# Patient Record
Sex: Male | Born: 1935 | Race: White | Hispanic: No | Smoking: Never smoker
Health system: Southern US, Community
[De-identification: ages and names within clinical notes are randomized; demographics above are authoritative.]

## PROBLEM LIST (undated history)

## (undated) DIAGNOSIS — I4891 Unspecified atrial fibrillation: Secondary | ICD-10-CM

## (undated) DIAGNOSIS — E7800 Pure hypercholesterolemia, unspecified: Secondary | ICD-10-CM

## (undated) DIAGNOSIS — J449 Chronic obstructive pulmonary disease, unspecified: Secondary | ICD-10-CM

## (undated) DIAGNOSIS — I10 Essential (primary) hypertension: Secondary | ICD-10-CM

## (undated) HISTORY — PX: REPLACEMENT TOTAL KNEE: SUR1224

## (undated) HISTORY — PX: CHOLECYSTECTOMY: SHX55

---

## 2003-12-15 ENCOUNTER — Other Ambulatory Visit: Payer: Self-pay

## 2004-10-04 ENCOUNTER — Ambulatory Visit: Payer: Self-pay | Admitting: Internal Medicine

## 2004-11-06 ENCOUNTER — Inpatient Hospital Stay: Payer: Self-pay | Admitting: Unknown Physician Specialty

## 2006-07-15 ENCOUNTER — Ambulatory Visit: Payer: Self-pay | Admitting: Unknown Physician Specialty

## 2006-07-24 ENCOUNTER — Ambulatory Visit: Payer: Self-pay | Admitting: Unknown Physician Specialty

## 2010-06-27 ENCOUNTER — Inpatient Hospital Stay: Payer: Self-pay | Admitting: General Surgery

## 2010-07-03 LAB — PATHOLOGY REPORT

## 2011-07-11 ENCOUNTER — Ambulatory Visit: Payer: Self-pay | Admitting: Unknown Physician Specialty

## 2011-09-04 ENCOUNTER — Ambulatory Visit: Payer: Self-pay | Admitting: Unknown Physician Specialty

## 2016-01-17 ENCOUNTER — Emergency Department: Payer: Medicare Other

## 2016-01-17 ENCOUNTER — Emergency Department
Admission: EM | Admit: 2016-01-17 | Discharge: 2016-01-18 | Disposition: A | Discharge status: Home / Self Care | Payer: Medicare Other | Attending: Student | Admitting: Student

## 2016-01-17 DIAGNOSIS — R4182 Altered mental status, unspecified: Secondary | ICD-10-CM | POA: Diagnosis present

## 2016-01-17 DIAGNOSIS — I4891 Unspecified atrial fibrillation: Secondary | ICD-10-CM | POA: Insufficient documentation

## 2016-01-17 DIAGNOSIS — I10 Essential (primary) hypertension: Secondary | ICD-10-CM

## 2016-01-17 DIAGNOSIS — J45909 Unspecified asthma, uncomplicated: Secondary | ICD-10-CM | POA: Insufficient documentation

## 2016-01-17 LAB — CBC
HEMATOCRIT: 46 % (ref 40.0–52.0)
HEMOGLOBIN: 15.5 g/dL (ref 13.0–18.0)
MCH: 30.5 pg (ref 26.0–34.0)
MCHC: 33.6 g/dL (ref 32.0–36.0)
MCV: 90.8 fL (ref 80.0–100.0)
Platelets: 203 10*3/uL (ref 150–440)
RBC: 5.07 MIL/uL (ref 4.40–5.90)
RDW: 13.5 % (ref 11.5–14.5)
WBC: 10 10*3/uL (ref 3.8–10.6)

## 2016-01-17 LAB — URINALYSIS COMPLETE WITH MICROSCOPIC (ARMC ONLY)
BILIRUBIN URINE: NEGATIVE
Bacteria, UA: NONE SEEN
GLUCOSE, UA: NEGATIVE mg/dL
Hgb urine dipstick: NEGATIVE
Ketones, ur: NEGATIVE mg/dL
Leukocytes, UA: NEGATIVE
NITRITE: NEGATIVE
PH: 6 (ref 5.0–8.0)
PROTEIN: NEGATIVE mg/dL
Specific Gravity, Urine: 1.009 (ref 1.005–1.030)
Squamous Epithelial / LPF: NONE SEEN
WBC, UA: NONE SEEN WBC/hpf (ref 0–5)

## 2016-01-17 LAB — BASIC METABOLIC PANEL
ANION GAP: 11 (ref 5–15)
BUN: 13 mg/dL (ref 6–20)
CALCIUM: 9.4 mg/dL (ref 8.9–10.3)
CHLORIDE: 99 mmol/L — AB (ref 101–111)
CO2: 27 mmol/L (ref 22–32)
CREATININE: 1.06 mg/dL (ref 0.61–1.24)
GFR calc non Af Amer: >60 mL/min (ref >60)
Glucose, Bld: 105 mg/dL — ABNORMAL HIGH (ref 65–99)
Potassium: 3.8 mmol/L (ref 3.5–5.1)
SODIUM: 137 mmol/L (ref 135–145)

## 2016-01-17 LAB — TROPONIN I

## 2016-01-17 NOTE — ED Notes (Signed)
Pt in with recent episodes of altered mental status, checked bp today and was elevated. Co generalized weakness no co pain.

## 2016-01-17 NOTE — ED Provider Notes (Signed)
[facility] Emergency Department Provider Note   ____________________________________________  Time seen: Approximately 8:22 PM  I have reviewed the triage vital signs and the nursing notes.   HISTORY  Chief Complaint Altered Mental Status    HPI Brian Lara is Lara 82 y.o. male with history of hypertension, history of atrial fibrillation, reactive airway disease, migraines who presents for evaluation of hypertension noted this evening, gradual onset, constant, no modifying factors, severe. Patient reports that he has felt "yucky" throughout the day, just did not feel like himself but is unable to further specify what he was feeling. He told his granddaughter and she checked his blood pressure which was really elevated with Lara systolic blood pressure in the 200s. She recommended he come to the emergency permit for evaluation. Currently he reports he feels fine. He has no chest pain, difficulty breathing, no headache, no vision changes, no numbness or weakness, no speech difficulty. He reports over the past several weeks he has had some times when he becomes confused but no confusion today. He reports 2 weeks ago, he "knew what I wanted to say but I couldn't get the words out". This lasted 5 minutes and resolved. He followed up with Dr. Lady Lara about this and is undergoing outpatient workup for possible TIAs. He has not experienced anything like that since.   No past medical history on file.  There are no active problems to display for this patient.   No past surgical history on file.  No current outpatient prescriptions on file.  Allergies Codeine and Morphine and related  No family history on file.  Social History Social History  Substance Use Topics  . Smoking status: Not on file  . Smokeless tobacco: Not on file  . Alcohol Use: Not on file    Review of Systems Constitutional: No fever/chills Eyes: No visual changes. ENT: No sore  throat. Cardiovascular: Denies chest pain. Respiratory: Denies shortness of breath. Gastrointestinal: No abdominal pain.  No nausea, no vomiting.  No diarrhea.  No constipation. Genitourinary: Negative for dysuria. Musculoskeletal: Negative for back pain. Skin: Negative for rash. Neurological: Negative for headaches, focal weakness or numbness.  10-point ROS otherwise negative.  ____________________________________________   PHYSICAL EXAM:  Filed Vitals:   01/17/16 2100 01/17/16 2130 01/17/16 2200 01/17/16 2303  BP: 178/78 179/79 171/83 150/80  Pulse: 54 57  60  Temp:      TempSrc:      Resp: 21  21 18   Height:      Weight:      SpO2: 95% 94%  94%    VITAL SIGNS: ED Triage Vitals  Enc Vitals Group     BP 01/17/16 1941 233/86 mmHg     Pulse Rate 01/17/16 1941 58     Resp 01/17/16 1941 18     Temp 01/17/16 1941 98.3 F (36.8 C)     Temp Source 01/17/16 1941 Oral     SpO2 01/17/16 1941 97 %     Weight 01/17/16 1941 198 lb (89.812 kg)     Height 01/17/16 1941 5\' 9"  (1.753 m)     Head Cir --      Peak Flow --      Pain Score --      Pain Loc --      Pain Edu? --      Excl. in GC? --     Constitutional: Alert and oriented x4. Well appearing and in no acute distress. Eyes: Conjunctivae are normal.  PERRL. EOMI. Head: Atraumatic. Nose: No congestion/rhinnorhea. Mouth/Throat: Mucous membranes are moist.  Oropharynx non-erythematous. Neck: No stridor. Supple without meningismus.  Cardiovascular: Normal rate, regular rhythm. Grossly normal heart sounds.  Good peripheral circulation. Respiratory: Normal respiratory effort.  No retractions. Lungs CTAB. Gastrointestinal: Soft and nontender. No distention. No CVA tenderness. Genitourinary: deferred Musculoskeletal: No lower extremity tenderness nor edema.  No joint effusions. Neurologic:  Normal speech and language. No gross focal neurologic deficits are appreciated. No gait instability. 5 out of 5 strength in bilateral  upper and lower extremities, sensation intact to light touch throughout, cranial nerves II through XII intact, normal finger-nose-finger. Skin:  Skin is warm, dry and intact. No rash noted. Psychiatric: Mood and affect are normal. Speech and behavior are normal.  ____________________________________________   LABS (all labs ordered are listed, but only abnormal results are displayed)  Labs Reviewed  BASIC METABOLIC PANEL - Abnormal; Notable for the following:    Chloride 99 (*)    Glucose, Bld 105 (*)    All other components within normal limits  URINALYSIS COMPLETEWITH MICROSCOPIC ([facility] ONLY) - Abnormal; Notable for the following:    Color, Urine YELLOW (*)    APPearance CLEAR (*)    All other components within normal limits  CBC  TROPONIN I   ____________________________________________  EKG  ED ECG REPORT I, Brian Lara, Brian Lara, the attending physician, personally viewed and interpreted this ECG.   Date: 01/17/2016  EKG Time: 19:48  Rate: 57  Rhythm: sinus bradycardia  Axis: left  Intervals:right bundle branch block  ST&T Change: No acute ST elevation or ST depression.  ____________________________________________  RADIOLOGY  CT head IMPRESSION: No acute intracranial abnormality. Moderate cerebral atrophy. Moderate periventricular and patchy subcortical white matter decreased attenuation probable due to chronic small vessel ischemic changes. No definite acute cortical infarction.   CXR IMPRESSION: Emphysematous changes and chronic bronchitic changes in the lungs. No evidence of active pulmonary disease. ____________________________________________   PROCEDURES  Procedure(s) performed: None  Critical Care performed: No  ____________________________________________   INITIAL IMPRESSION / ASSESSMENT AND PLAN / ED COURSE  Pertinent labs & imaging results that were available during my care of the patient were reviewed by me and considered in my medical  decision making (see chart for details).  Brian Lara is Lara 82 y.o. male with history of hypertension, history of atrial fibrillation, reactive airway disease, migraines who presents for evaluation of hypertension noted this evening. On exam, he is very well-appearing and in no acute distress. He has an intact neurological examination. His blood pressure was 233/86 howover this has improved to 171/84 without any intervention, we'll continue to monitor. The remainder of his vital signs are stable and he is afebrile. We'll obtain screening labs, CT head, chest x-ray and reassess for disposition. No clinical evidence currently to suggest end organ dysfunction as Lara result of his hypertension.  ----------------------------------------- 11:04 PM on 01/17/2016 ----------------------------------------- The patient's vital signs remainsstable, his blood pressure remains approximately 171/84, he has not required any antihypertensive medication since his arrival. EKG not consistent with ischemia, troponin negative, chest x-ray shows just evidence of reactive airway disease/bronchitic changes. CBC BMP unremarkable, negative troponin. Urinalysis is not consistent with infection, few RBCs noted. CT head shows no acute intracranial process. Return precautions, need for close PCP follow-up for titration of his antihypertensive medications, he is comfortable with the discharge plan. DC home.  ____________________________________________   FINAL CLINICAL IMPRESSION(S) / ED DIAGNOSES  Final diagnoses:  Essential hypertension  NEW MEDICATIONS STARTED DURING THIS VISIT:  New Prescriptions   No medications on file     Note:  This document was prepared using Dragon voice recognition software and may include unintentional dictation errors.    Brian Doss, MD 01/17/16 [id number]

## 2016-01-17 NOTE — ED Notes (Signed)
Pt to ED today for high blood pressure.  He reports he recently had an episode of expressive aphasia that resolved itself and had been following up with cardiologist, Dr. Lady GaryFath.  Pt denies confusion or aphasia today, just states he feels "yucky" with generalized weakness.

## 2016-01-17 NOTE — Discharge Instructions (Signed)
You were seen in the emergency department for high blood pressure which improved on its own. We found very a few red blood cells in your urine, this may resolve on its own but you need to follow up with your primary care doctor as soon as possible so that they can recheck your blood pressure and recheck your urine. Return immediately to the emergency department if you develop any headache, chest pain, difficulty breathing, speech difficulty, numbness, weakness, confusion, fevers, vomiting, diarrhea, or for any other concerns.

## 2018-04-30 ENCOUNTER — Emergency Department
Admission: EM | Admit: 2018-04-30 | Discharge: 2018-04-30 | Disposition: A | Discharge status: Home / Self Care | Payer: Medicare Other | Attending: Emergency Medicine | Admitting: Emergency Medicine

## 2018-04-30 ENCOUNTER — Other Ambulatory Visit: Payer: Self-pay

## 2018-04-30 ENCOUNTER — Emergency Department: Payer: Medicare Other

## 2018-04-30 ENCOUNTER — Encounter: Payer: Self-pay | Admitting: Emergency Medicine

## 2018-04-30 DIAGNOSIS — Y9389 Activity, other specified: Secondary | ICD-10-CM | POA: Diagnosis not present

## 2018-04-30 DIAGNOSIS — Y92003 Bedroom of unspecified non-institutional (private) residence as the place of occurrence of the external cause: Secondary | ICD-10-CM | POA: Diagnosis not present

## 2018-04-30 DIAGNOSIS — Z23 Encounter for immunization: Secondary | ICD-10-CM | POA: Diagnosis not present

## 2018-04-30 DIAGNOSIS — Z7982 Long term (current) use of aspirin: Secondary | ICD-10-CM | POA: Insufficient documentation

## 2018-04-30 DIAGNOSIS — S0990XA Unspecified injury of head, initial encounter: Secondary | ICD-10-CM | POA: Diagnosis present

## 2018-04-30 DIAGNOSIS — Z79899 Other long term (current) drug therapy: Secondary | ICD-10-CM | POA: Diagnosis not present

## 2018-04-30 DIAGNOSIS — Z96653 Presence of artificial knee joint, bilateral: Secondary | ICD-10-CM | POA: Insufficient documentation

## 2018-04-30 DIAGNOSIS — W06XXXA Fall from bed, initial encounter: Secondary | ICD-10-CM | POA: Diagnosis not present

## 2018-04-30 DIAGNOSIS — I10 Essential (primary) hypertension: Secondary | ICD-10-CM | POA: Insufficient documentation

## 2018-04-30 DIAGNOSIS — S0181XA Laceration without foreign body of other part of head, initial encounter: Secondary | ICD-10-CM | POA: Diagnosis not present

## 2018-04-30 DIAGNOSIS — Y999 Unspecified external cause status: Secondary | ICD-10-CM | POA: Diagnosis not present

## 2018-04-30 DIAGNOSIS — R030 Elevated blood-pressure reading, without diagnosis of hypertension: Secondary | ICD-10-CM

## 2018-04-30 HISTORY — DX: Pure hypercholesterolemia, unspecified: E78.00

## 2018-04-30 HISTORY — DX: Essential (primary) hypertension: I10

## 2018-04-30 LAB — CBC
HCT: 43.9 % (ref 40.0–52.0)
Hemoglobin: 15 g/dL (ref 13.0–18.0)
MCH: 31.8 pg (ref 26.0–34.0)
MCHC: 34.2 g/dL (ref 32.0–36.0)
MCV: 93 fL (ref 80.0–100.0)
Platelets: 168 10*3/uL (ref 150–440)
RBC: 4.73 MIL/uL (ref 4.40–5.90)
RDW: 13.4 % (ref 11.5–14.5)
WBC: 10.5 10*3/uL (ref 3.8–10.6)

## 2018-04-30 LAB — COMPREHENSIVE METABOLIC PANEL
ALBUMIN: 3.8 g/dL (ref 3.5–5.0)
ALT: 15 U/L (ref 0–44)
ANION GAP: 12 (ref 5–15)
AST: 20 U/L (ref 15–41)
Alkaline Phosphatase: 51 U/L (ref 38–126)
BUN: 15 mg/dL (ref 8–23)
CO2: 26 mmol/L (ref 22–32)
Calcium: 9.1 mg/dL (ref 8.9–10.3)
Chloride: 101 mmol/L (ref 98–111)
Creatinine, Ser: 1.13 mg/dL (ref 0.61–1.24)
GFR calc Af Amer: >60 mL/min (ref >60)
GFR calc non Af Amer: 55 mL/min — ABNORMAL LOW (ref >60)
GLUCOSE: 123 mg/dL — AB (ref 70–99)
POTASSIUM: 4.1 mmol/L (ref 3.5–5.1)
SODIUM: 139 mmol/L (ref 135–145)
Total Bilirubin: 0.6 mg/dL (ref 0.3–1.2)
Total Protein: 7.3 g/dL (ref 6.5–8.1)

## 2018-04-30 LAB — TROPONIN I: Troponin I: <0.03 ng/mL (ref <0.03)

## 2018-04-30 MED ORDER — LIDOCAINE HCL (PF) 1 % IJ SOLN
5.0000 mL | Freq: Once | INTRAMUSCULAR | Status: AC
  Administered 2018-04-30: 5 mL via INTRADERMAL
  Filled 2018-04-30: qty 5

## 2018-04-30 MED ORDER — TETANUS-DIPHTH-ACELL PERTUSSIS 5-2.5-18.5 LF-MCG/0.5 IM SUSP
0.5000 mL | Freq: Once | INTRAMUSCULAR | Status: AC
  Administered 2018-04-30: 0.5 mL via INTRAMUSCULAR
  Filled 2018-04-30: qty 0.5

## 2018-04-30 MED ORDER — CEPHALEXIN 500 MG PO CAPS: 500.0000 mg | ORAL_CAPSULE | Freq: Four times a day (QID) | ORAL | 0 refills | Status: AC

## 2018-04-30 NOTE — ED Provider Notes (Signed)
[facility] Emergency Department Provider Note  ____________________________________________  Time seen: Approximately 7:52 AM  I have reviewed the triage vital signs and the nursing notes.   HISTORY  Chief Complaint Laceration    HPI Brian Lara is a 84 y.o. male that presents the emergency department for evaluation of head laceration after falling out of bed last night.  Patient states that he can vividly remember the dream and takes medications for his dreams.  He was dreaming about driving down the street and reaching out to grab a car handle when he fell forward out of bed.  He hit his head on the nightstand.  He did not lose consciousness.  He has not had any pain since fall.  He feels exactly like he always does.  Granddaughter and son states that he is behaving normally.  He saw his cardiologist yesterday.  He states his blood pressure raises when he gets "shook up." He has been walking since fall.  He is here with his son and granddaughter.  He takes aspirin.  He denies headache, dizziness, visual changes, neck pain, shortness of breath, chest pain, abdominal pain.  Past Medical History:  Diagnosis Date  . Hypercholesteremia   . Hypertension     There are no active problems to display for this patient.   Past Surgical History:  Procedure Laterality Date  . CHOLECYSTECTOMY    . REPLACEMENT TOTAL KNEE Bilateral     Prior to Admission medications   Medication Sig Start Date End Date Taking? Authorizing Provider  amLODipine (NORVASC) 5 MG tablet Take 5 mg by mouth daily.   Yes [provider]  aspirin 325 MG tablet Take 325 mg by mouth daily.   Yes [provider]  atorvastatin (LIPITOR) 10 MG tablet Take 10 mg by mouth daily.   Yes [provider]  citalopram (CELEXA) 10 MG tablet Take 10 mg by mouth daily.   Yes [provider]  finasteride (PROSCAR) 5 MG tablet Take 5 mg by mouth daily.   Yes [provider]  fluticasone (FLONASE) 50 MCG/ACT nasal spray Place 1 spray into both nostrils daily.   Yes [provider]  cephALEXin (KEFLEX) 500 MG capsule Take 1 capsule (500 mg total) by mouth 4 (four) times daily for 10 days. 04/30/18 05/10/18  Enid Derry, PA-C    Allergies Codeine and Morphine and related  No family history on file.  Social History Social History   Tobacco Use  . Smoking status: Never Smoker  . Smokeless tobacco: Never Used  Substance Use Topics  . Alcohol use: Never    Frequency: Never  . Drug use: Not on file     Review of Systems  ENT: No upper respiratory complaints. Cardiovascular: No chest pain. Respiratory: No cough. No SOB. Gastrointestinal: No abdominal pain.  No nausea, no vomiting.  Musculoskeletal: Negative for musculoskeletal pain. Skin: Negative for rash, abrasions, ecchymosis.  Positive for laceration. Neurological: Negative for headaches   ____________________________________________   PHYSICAL EXAM:  VITAL SIGNS: ED Triage Vitals  Enc Vitals Group     BP 04/30/18 0707 (!) 195/73     Pulse Rate 04/30/18 0706 (!) 57     Resp 04/30/18 0732 16     Temp 04/30/18 0706 98.4 F (36.9 C)     Temp Source 04/30/18 0706 Oral     SpO2 04/30/18 0707 90 %     Weight 04/30/18 0707 197 lb (89.4 kg)     Height 04/30/18  1610 5\' 8"  (1.727 m)     Head Circumference --      Peak Flow --      Pain Score 04/30/18 0707 5     Pain Loc --      Pain Edu? --      Excl. in GC? --      Constitutional: Alert and oriented. Well appearing and in no acute distress. Eyes: Conjunctivae are normal. PERRL. EOMI. Head: 3 cm laceration to frontal scalp. ENT:      Ears:      Nose: No congestion/rhinnorhea.      Mouth/Throat: Mucous membranes are moist.  Neck: No stridor.  No cervical spine tenderness to palpation. Cardiovascular: Normal rate, regular rhythm.  Good peripheral circulation. Respiratory: Normal respiratory effort without  tachypnea or retractions. Lungs CTAB. Good air entry to the bases with no decreased or absent breath sounds. Gastrointestinal: Bowel sounds 4 quadrants. Soft and nontender to palpation. No guarding or rigidity. No palpable masses. No distention.  Musculoskeletal: Full range of motion to all extremities. No gross deformities appreciated. Neurologic:  Normal speech and language. No gross focal neurologic deficits are appreciated.  Skin:  Skin is warm, dry and intact. No rash noted. Psychiatric: Mood and affect are normal. Speech and behavior are normal. Patient exhibits appropriate insight and judgement.   ____________________________________________   LABS (all labs ordered are listed, but only abnormal results are displayed)  Labs Reviewed  COMPREHENSIVE METABOLIC PANEL - Abnormal; Notable for the following components:      Result Value   Glucose, Bld 123 (*)    GFR calc non Af Amer 55 (*)    All other components within normal limits  CBC  TROPONIN I   ____________________________________________  EKG  SB ____________________________________________  RADIOLOGY Lexine Baton, personally viewed and evaluated these images (plain radiographs) as part of my medical decision making, as well as reviewing the written report by the radiologist.  Dg Chest 2 View  Result Date: 04/30/2018 CLINICAL DATA:  Patient fell out of bed striking his head. History of hypertension, low oxygen saturation while here. EXAM: CHEST - 2 VIEW COMPARISON:  Chest x-ray of January 17, 2016 FINDINGS: The lungs are adequately inflated. The interstitial markings are coarse though stable. There is no pleural effusion or alveolar infiltrate. The heart and pulmonary vascularity are normal. There is calcification in the wall of the thoracic aorta. The observed bony thorax exhibits no acute abnormality. IMPRESSION: Mild chronic bronchitic changes, stable. No pneumonia, CHF, nor other acute cardiopulmonary abnormality.  Thoracic aortic atherosclerosis. Electronically Signed   By: David  [place] M.D.   On: 04/30/2018 08:22   Ct Head Wo Contrast  Result Date: 04/30/2018 CLINICAL DATA:  Recent minor head trauma, initial encounter EXAM: CT HEAD WITHOUT CONTRAST CT CERVICAL SPINE WITHOUT CONTRAST TECHNIQUE: Multidetector CT imaging of the head and cervical spine was performed following the standard protocol without intravenous contrast. Multiplanar CT image reconstructions of the cervical spine were also generated. COMPARISON:  CT of the head from 01/17/2016 FINDINGS: CT HEAD FINDINGS Brain: Mild atrophic and chronic white matter ischemic changes are identified. No findings to suggest acute hemorrhage, acute infarction or space-occupying mass lesion are noted. Vascular: No hyperdense vessel or unexpected calcification. Skull: Normal. Negative for fracture or focal lesion. Sinuses/Orbits: No acute finding. Other: Mild scalp hematoma is noted in the left frontal region near the vertex. CT CERVICAL SPINE FINDINGS Alignment: Mild degenerative anterolisthesis of C4 on C5 is noted. Skull base and vertebrae: 7 cervical  segments are well visualized. Vertebral body height is well maintained. Multilevel facet hypertrophic changes are noted. No acute fracture or facet abnormality is noted. Soft tissues and spinal canal: Vascular calcifications are noted. The left lobe of the thyroid is enlarged extending into the superior mediastinum and incompletely evaluated on this exam. No discrete nodule is noted. Some scattered calcifications are seen. Upper chest: Within normal limits. Other: None IMPRESSION: CT of the head: Scalp hematoma in the left frontal region near the vertex Chronic atrophic and ischemic changes. CT of the cervical spine: Multilevel degenerative changes as described without acute abnormality. Electronically Signed   By: Alcide Clever M.D.   On: 04/30/2018 08:27   Ct Cervical Spine Wo Contrast  Result Date: 04/30/2018 CLINICAL  DATA:  Recent minor head trauma, initial encounter EXAM: CT HEAD WITHOUT CONTRAST CT CERVICAL SPINE WITHOUT CONTRAST TECHNIQUE: Multidetector CT imaging of the head and cervical spine was performed following the standard protocol without intravenous contrast. Multiplanar CT image reconstructions of the cervical spine were also generated. COMPARISON:  CT of the head from 01/17/2016 FINDINGS: CT HEAD FINDINGS Brain: Mild atrophic and chronic white matter ischemic changes are identified. No findings to suggest acute hemorrhage, acute infarction or space-occupying mass lesion are noted. Vascular: No hyperdense vessel or unexpected calcification. Skull: Normal. Negative for fracture or focal lesion. Sinuses/Orbits: No acute finding. Other: Mild scalp hematoma is noted in the left frontal region near the vertex. CT CERVICAL SPINE FINDINGS Alignment: Mild degenerative anterolisthesis of C4 on C5 is noted. Skull base and vertebrae: 7 cervical segments are well visualized. Vertebral body height is well maintained. Multilevel facet hypertrophic changes are noted. No acute fracture or facet abnormality is noted. Soft tissues and spinal canal: Vascular calcifications are noted. The left lobe of the thyroid is enlarged extending into the superior mediastinum and incompletely evaluated on this exam. No discrete nodule is noted. Some scattered calcifications are seen. Upper chest: Within normal limits. Other: None IMPRESSION: CT of the head: Scalp hematoma in the left frontal region near the vertex Chronic atrophic and ischemic changes. CT of the cervical spine: Multilevel degenerative changes as described without acute abnormality. Electronically Signed   By: Alcide Clever M.D.   On: 04/30/2018 08:27    ____________________________________________    PROCEDURES  Procedure(s) performed:    Procedures    Medications  lidocaine (PF) (XYLOCAINE) 1 % injection 5 mL (5 mLs Intradermal Given 04/30/18 0801)  Tdap  (BOOSTRIX) injection 0.5 mL (0.5 mLs Intramuscular Given 04/30/18 0801)     ____________________________________________   INITIAL IMPRESSION / ASSESSMENT AND PLAN / ED COURSE  Pertinent labs & imaging results that were available during my care of the patient were reviewed by me and considered in my medical decision making (see chart for details).  Review of the [place] CSRS was performed in accordance of the [facility] prior to dispensing any controlled drugs.     Patient's diagnosis is consistent with forehead laceration.  Vital signs and exam are reassuring.  Laceration was repaired with staples.  CT head, cervical are consistent with chronic changes.  Blood pressure is elevated in ED, likely due to shock of incident and pain.  Blood pressure improved while in ED.  Patient is asymptomatic.  Troponin is negative.  EKG similar to previous.  No acute abnormalities on chest x-ray.  Blood work largely unremarkable.  Patient is extremely well-appearing.  He is very talkative.  All lab work and imaging findings were discussed with patient and family.  He  will be sending the day with his son and granddaughter. He will be given a prescription for Keflex.   Patient is to follow up with primary care as directed.  Granddaughter and son are going to make an appointment for tomorrow.  Patient is given ED precautions to return to the ED for any worsening or new symptoms.     ____________________________________________  FINAL CLINICAL IMPRESSION(S) / ED DIAGNOSES  Final diagnoses:  Injury of head, initial encounter  Elevated blood pressure reading      NEW MEDICATIONS STARTED DURING THIS VISIT:  ED Discharge Orders         Ordered    cephALEXin (KEFLEX) 500 MG capsule  4 times daily     04/30/18 0949              This chart was dictated using voice recognition software/Dragon. Despite best efforts to proofread, errors can occur which can change the meaning. Any change was purely  unintentional.    Enid Derry, PA-C 04/30/18 1548    Governor Rooks, MD 05/02/18 [phone]

## 2018-04-30 NOTE — ED Notes (Signed)
See triage note  States he was having a "silly" dream  And fell out of bed and hit a night stand  Laceration noted to top of head  No LOC

## 2018-04-30 NOTE — ED Provider Notes (Signed)
-----------------------------------------   8:08 AM on 04/30/2018 -----------------------------------------  ED ECG REPORT I, Loleta Rose, the attending physician, personally viewed and interpreted this ECG.  Date: 04/30/2018 EKG Time: 8:08 AM Rate: 56 Rhythm: Sinus bradycardia QRS Axis: normal Intervals: Right bundle branch block with possible left anterior fascicular block ST/T Wave abnormalities: Non-specific ST segment / T-wave changes, but no evidence of acute ischemia. Narrative Interpretation: no evidence of acute ischemia.  Very similar appearance to prior EKG from 2017.     Loleta Rose, MD 04/30/18 [phone]

## 2018-04-30 NOTE — Discharge Instructions (Addendum)
Please follow-up with your primary care provider in 48 hours.  Return to the emergency department immediately for any change of symptoms.

## 2018-04-30 NOTE — ED Triage Notes (Signed)
No LOC with fall.

## 2018-04-30 NOTE — ED Triage Notes (Signed)
Says he was dreaming and fell off the bed.  Most likely hit head on bedside table. Laceration to forehead.

## 2019-08-19 IMAGING — CT CT CERVICAL SPINE W/O CM
4 of 7 series · 14 of 33 positions shown, 16 images · non-contrast
Comparison: CT of the head from 01/17/2016

CLINICAL DATA: Recent minor head trauma, initial encounter

EXAM:
CT HEAD WITHOUT CONTRAST
CT CERVICAL SPINE WITHOUT CONTRAST
TECHNIQUE: Multidetector CT imaging of the head and cervical spine was
performed following the standard protocol without intravenous
contrast. Multiplanar CT image reconstructions of the cervical spine
were also generated.

[Series 5: c spine soft · axial · 0.30mm/px · z∈[-198,-154]mm · 2 of 89 slices shown]
[im 23/89  soft-tissue]
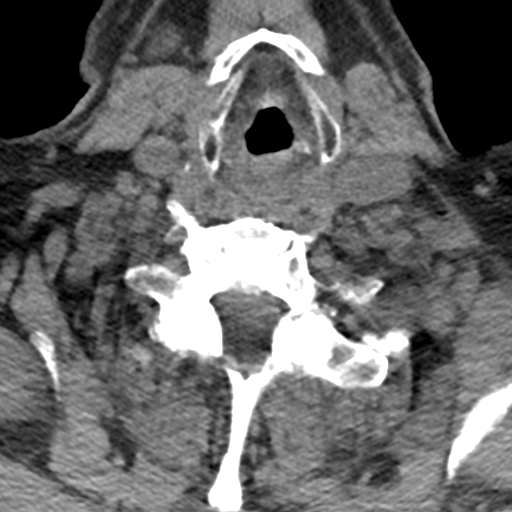
[im 45/89  soft-tissue]
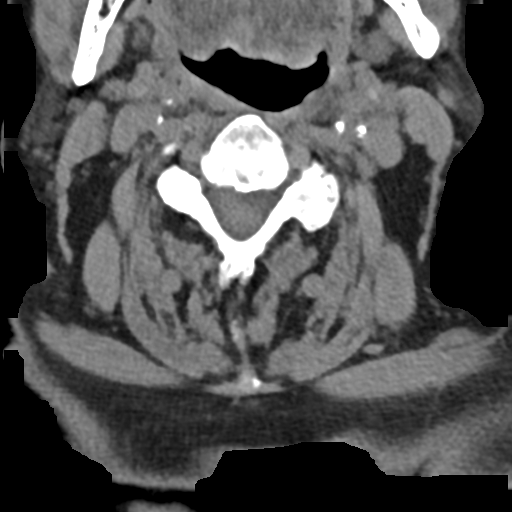

[Series 8: coronal soft tissue · coronal · 0.30mm/px · 3 of 69 slices shown]
[im 18/69  bone]
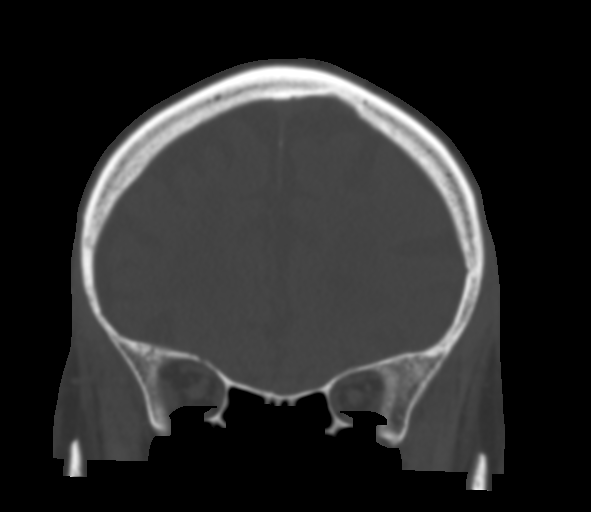
[im 35/69  bone]
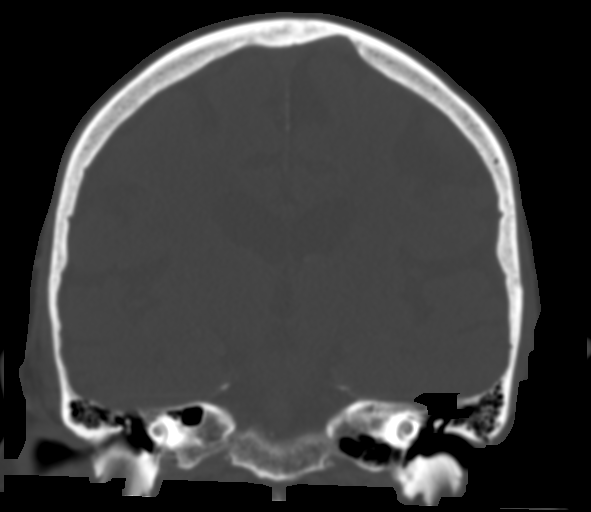
[im 52/69  bone]
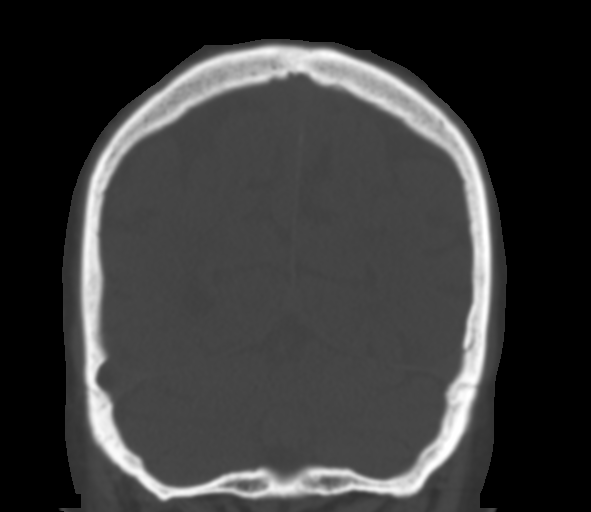

[Series 10: sagittal bone · sagittal · 0.25mm/px · 4 of 48 slices shown]
[im 10/48  bone]
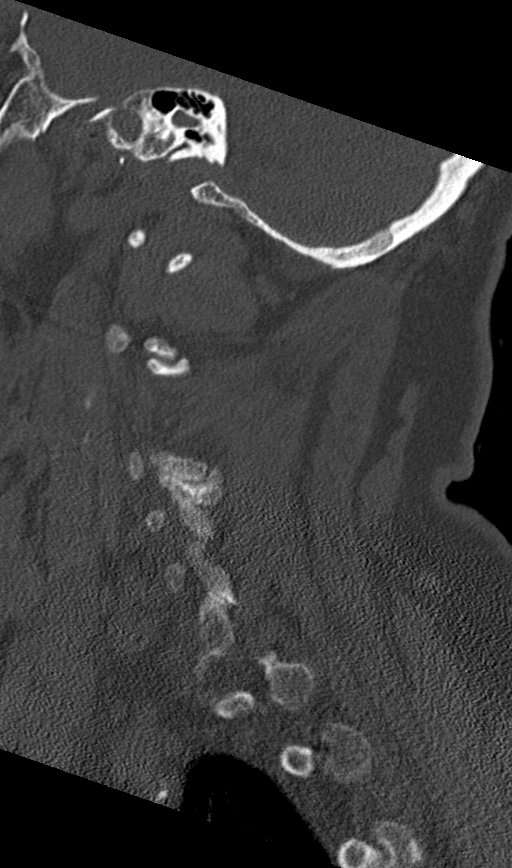
[im 19/48  bone]
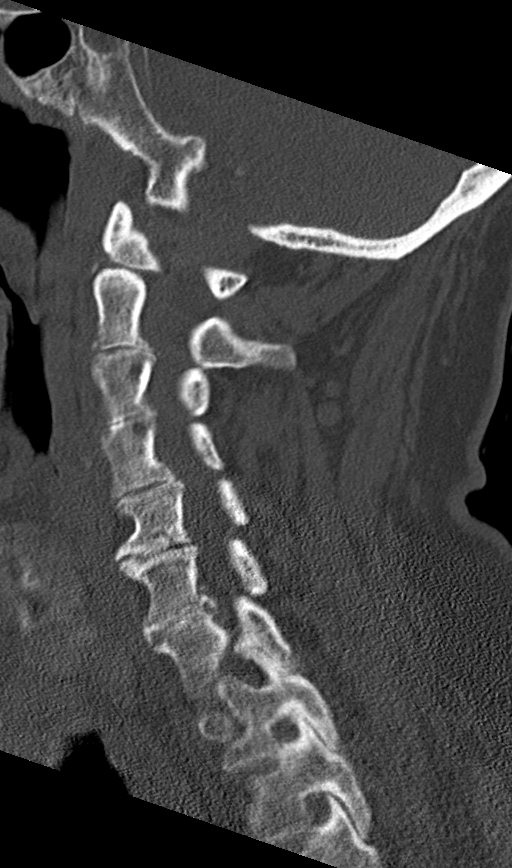
[im 29/48  bone]
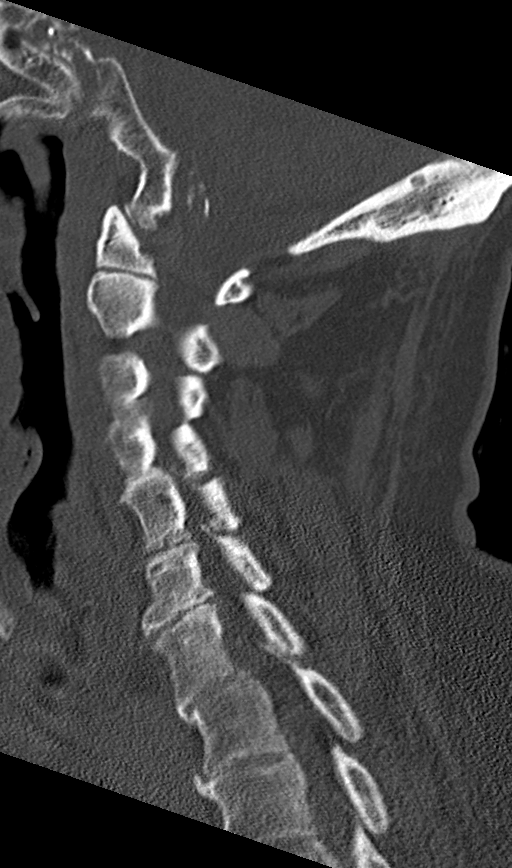
[im 38/48  bone]
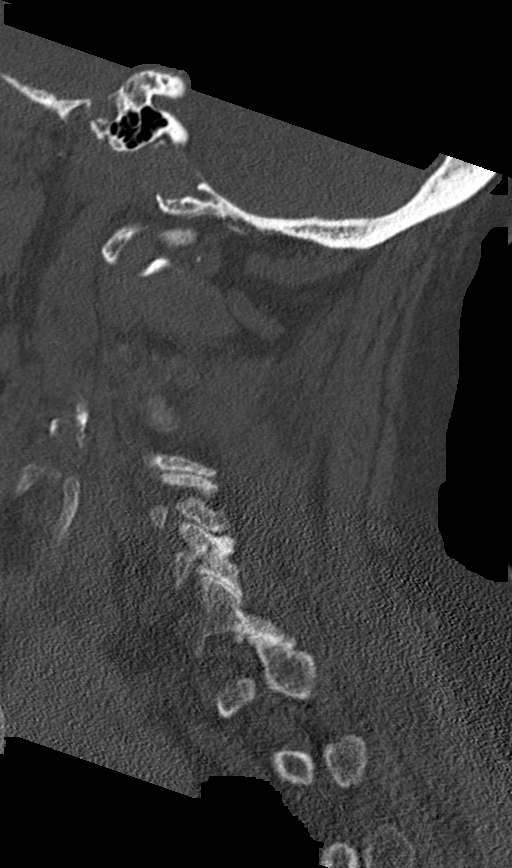

[Series 12: orthogonal bone · axial · 0.24mm/px · z∈[-251,-104]mm · 5 of 118 slices shown, 7 images]
[im 20/118  soft-tissue]
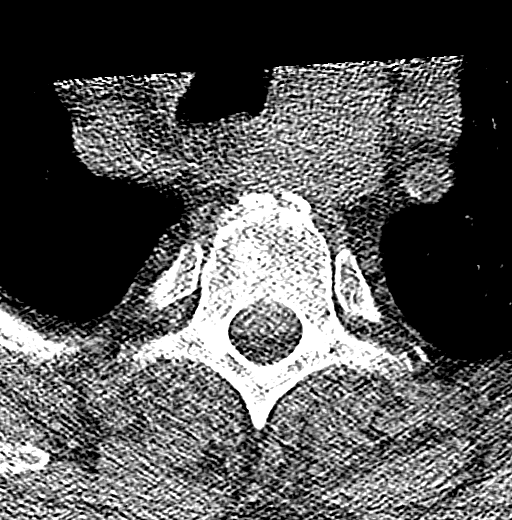
[im 20/118  bone]
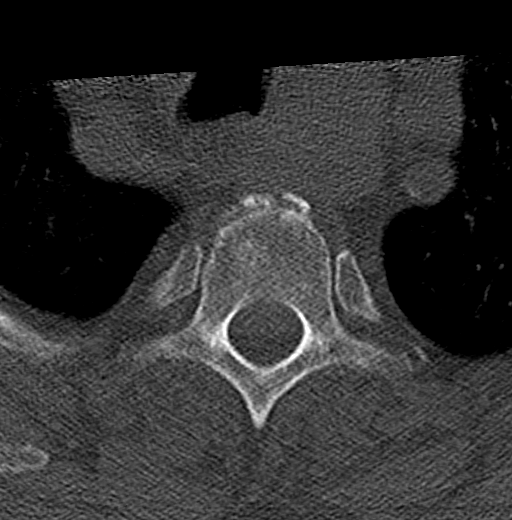
[im 40/118  bone]
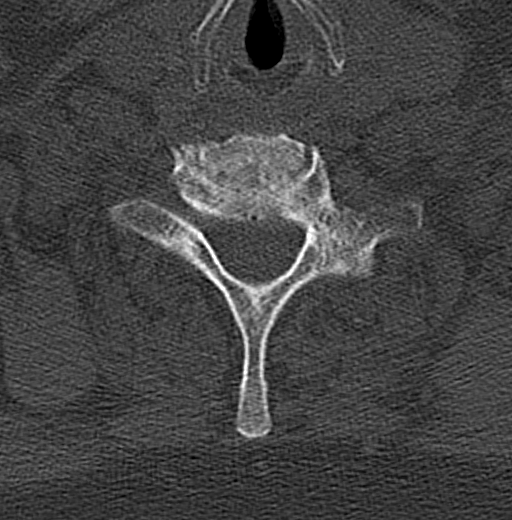
[im 59/118  bone]
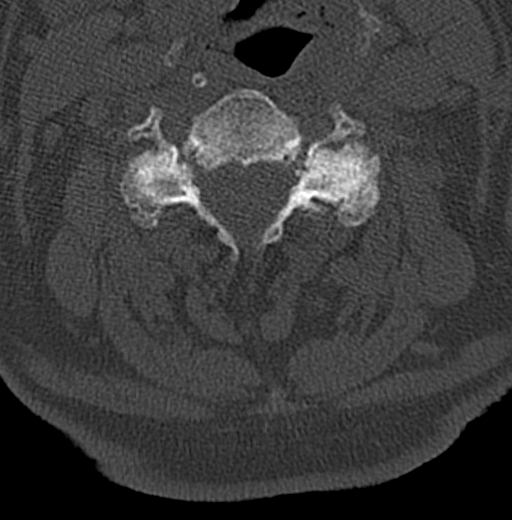
[im 79/118  bone]
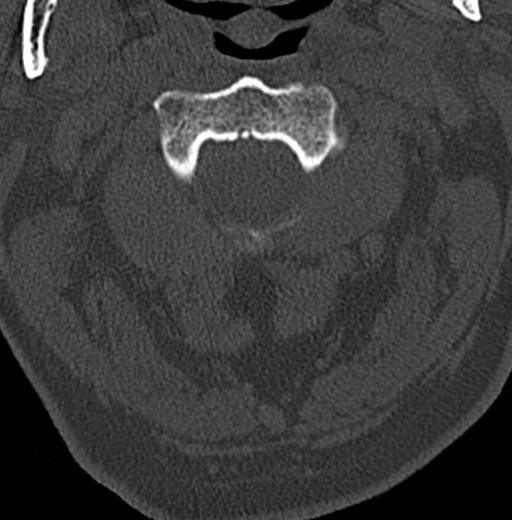
[im 98/118  soft-tissue]
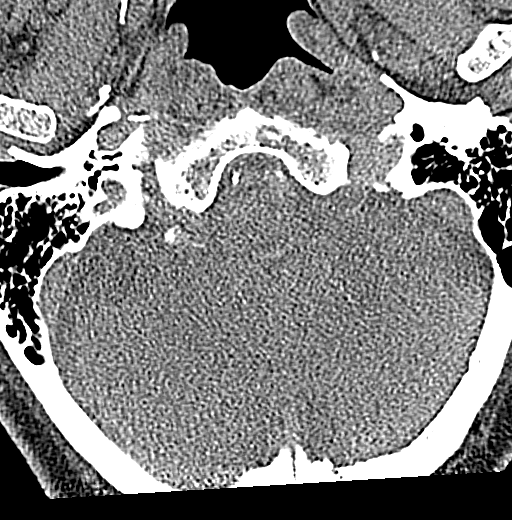
[im 98/118  bone]
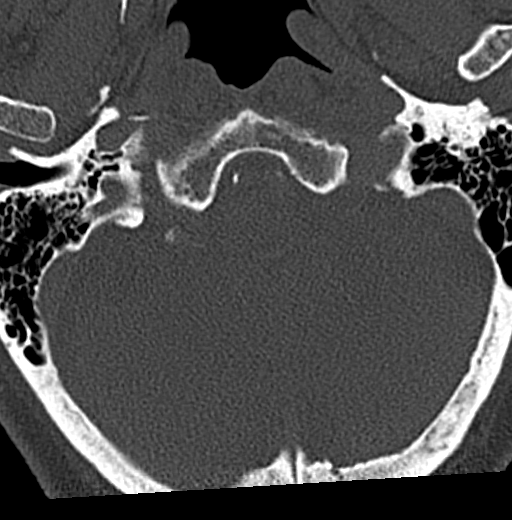

[14 of 33 positions shown; findings below may reference images not displayed]

FINDINGS: CT HEAD FINDINGS

Brain: Mild atrophic and chronic white matter ischemic changes are
identified. No findings to suggest acute hemorrhage, acute
infarction or space-occupying mass lesion are noted.

Vascular: No hyperdense vessel or unexpected calcification.

Skull: Normal. Negative for fracture or focal lesion.

Sinuses/Orbits: No acute finding.

Other: Mild scalp hematoma is noted in the left frontal region near
the vertex.

CT CERVICAL SPINE FINDINGS

Alignment: Mild degenerative anterolisthesis of C4 on C5 is noted.

Skull base and vertebrae: 7 cervical segments are well visualized.
Vertebral body height is well maintained. Multilevel facet
hypertrophic changes are noted. No acute fracture or facet
abnormality is noted.

Soft tissues and spinal canal: Vascular calcifications are noted.
The left lobe of the thyroid is enlarged extending into the superior
mediastinum and incompletely evaluated on this exam. No discrete
nodule is noted. Some scattered calcifications are seen.

Upper chest: Within normal limits.

Other: None
IMPRESSION: CT of the head: Scalp hematoma in the left frontal region near the
vertex

Chronic atrophic and ischemic changes.

CT of the cervical spine: Multilevel degenerative changes as
described without acute abnormality.

## 2020-11-10 ENCOUNTER — Encounter: Payer: Self-pay | Admitting: Emergency Medicine

## 2020-11-10 ENCOUNTER — Inpatient Hospital Stay
Admission: EM | Admit: 2020-11-10 | Discharge: 2020-11-13 | DRG: 291 | Disposition: A | Discharge status: Home Health | Payer: Medicare Other | Attending: Internal Medicine | Admitting: Internal Medicine

## 2020-11-10 ENCOUNTER — Emergency Department: Payer: Medicare Other

## 2020-11-10 ENCOUNTER — Other Ambulatory Visit: Payer: Self-pay

## 2020-11-10 DIAGNOSIS — Z96653 Presence of artificial knee joint, bilateral: Secondary | ICD-10-CM | POA: Diagnosis present

## 2020-11-10 DIAGNOSIS — Z66 Do not resuscitate: Secondary | ICD-10-CM | POA: Diagnosis present

## 2020-11-10 DIAGNOSIS — I5033 Acute on chronic diastolic (congestive) heart failure: Secondary | ICD-10-CM | POA: Diagnosis present

## 2020-11-10 DIAGNOSIS — I509 Heart failure, unspecified: Secondary | ICD-10-CM | POA: Diagnosis not present

## 2020-11-10 DIAGNOSIS — Z79899 Other long term (current) drug therapy: Secondary | ICD-10-CM | POA: Diagnosis not present

## 2020-11-10 DIAGNOSIS — I130 Hypertensive heart and chronic kidney disease with heart failure and stage 1 through stage 4 chronic kidney disease, or unspecified chronic kidney disease: Principal | ICD-10-CM | POA: Diagnosis present

## 2020-11-10 DIAGNOSIS — J9601 Acute respiratory failure with hypoxia: Secondary | ICD-10-CM | POA: Diagnosis present

## 2020-11-10 DIAGNOSIS — I4891 Unspecified atrial fibrillation: Secondary | ICD-10-CM | POA: Diagnosis present

## 2020-11-10 DIAGNOSIS — I5031 Acute diastolic (congestive) heart failure: Secondary | ICD-10-CM | POA: Diagnosis not present

## 2020-11-10 DIAGNOSIS — I10 Essential (primary) hypertension: Secondary | ICD-10-CM | POA: Diagnosis present

## 2020-11-10 DIAGNOSIS — I480 Paroxysmal atrial fibrillation: Secondary | ICD-10-CM | POA: Diagnosis present

## 2020-11-10 DIAGNOSIS — Z20822 Contact with and (suspected) exposure to covid-19: Secondary | ICD-10-CM | POA: Diagnosis present

## 2020-11-10 DIAGNOSIS — Z7982 Long term (current) use of aspirin: Secondary | ICD-10-CM | POA: Diagnosis not present

## 2020-11-10 DIAGNOSIS — I482 Chronic atrial fibrillation, unspecified: Secondary | ICD-10-CM | POA: Diagnosis present

## 2020-11-10 DIAGNOSIS — R0602 Shortness of breath: Secondary | ICD-10-CM | POA: Diagnosis present

## 2020-11-10 DIAGNOSIS — E7800 Pure hypercholesterolemia, unspecified: Secondary | ICD-10-CM | POA: Diagnosis present

## 2020-11-10 DIAGNOSIS — N1831 Chronic kidney disease, stage 3a: Secondary | ICD-10-CM | POA: Diagnosis present

## 2020-11-10 DIAGNOSIS — Z885 Allergy status to narcotic agent status: Secondary | ICD-10-CM | POA: Diagnosis not present

## 2020-11-10 DIAGNOSIS — E785 Hyperlipidemia, unspecified: Secondary | ICD-10-CM | POA: Diagnosis present

## 2020-11-10 DIAGNOSIS — J441 Chronic obstructive pulmonary disease with (acute) exacerbation: Secondary | ICD-10-CM | POA: Diagnosis present

## 2020-11-10 DIAGNOSIS — F32A Depression, unspecified: Secondary | ICD-10-CM | POA: Diagnosis present

## 2020-11-10 DIAGNOSIS — N183 Chronic kidney disease, stage 3 unspecified: Secondary | ICD-10-CM | POA: Diagnosis present

## 2020-11-10 HISTORY — DX: Unspecified atrial fibrillation: I48.91

## 2020-11-10 HISTORY — DX: Chronic obstructive pulmonary disease, unspecified: J44.9

## 2020-11-10 LAB — CBC WITH DIFFERENTIAL/PLATELET
Abs Immature Granulocytes: 0.03 10*3/uL (ref 0.00–0.07)
Basophils Absolute: 0.1 10*3/uL (ref 0.0–0.1)
Basophils Relative: 1 %
Eosinophils Absolute: 0.1 10*3/uL (ref 0.0–0.5)
Eosinophils Relative: 1 %
HCT: 41.6 % (ref 39.0–52.0)
Hemoglobin: 13.6 g/dL (ref 13.0–17.0)
Immature Granulocytes: 0 %
Lymphocytes Relative: 25 %
Lymphs Abs: 2.2 10*3/uL (ref 0.7–4.0)
MCH: 31.1 pg (ref 26.0–34.0)
MCHC: 32.7 g/dL (ref 30.0–36.0)
MCV: 95.2 fL (ref 80.0–100.0)
Monocytes Absolute: 0.6 10*3/uL (ref 0.1–1.0)
Monocytes Relative: 7 %
Neutro Abs: 5.7 10*3/uL (ref 1.7–7.7)
Neutrophils Relative %: 66 %
Platelets: 157 10*3/uL (ref 150–400)
RBC: 4.37 MIL/uL (ref 4.22–5.81)
RDW: 13.6 % (ref 11.5–15.5)
WBC: 8.6 10*3/uL (ref 4.0–10.5)
nRBC: 0 % (ref 0.0–0.2)

## 2020-11-10 LAB — BASIC METABOLIC PANEL
Anion gap: 10 (ref 5–15)
BUN: 16 mg/dL (ref 8–23)
CO2: 25 mmol/L (ref 22–32)
Calcium: 9.2 mg/dL (ref 8.9–10.3)
Chloride: 95 mmol/L — ABNORMAL LOW (ref 98–111)
Creatinine, Ser: 1.22 mg/dL (ref 0.61–1.24)
GFR, Estimated: 56 mL/min — ABNORMAL LOW (ref >60)
Glucose, Bld: 128 mg/dL — ABNORMAL HIGH (ref 70–99)
Potassium: 4.3 mmol/L (ref 3.5–5.1)
Sodium: 130 mmol/L — ABNORMAL LOW (ref 135–145)

## 2020-11-10 LAB — TROPONIN I (HIGH SENSITIVITY): Troponin I (High Sensitivity): 16 ng/L (ref <18)

## 2020-11-10 LAB — BRAIN NATRIURETIC PEPTIDE: B Natriuretic Peptide: 599.6 pg/mL — ABNORMAL HIGH (ref 0.0–100.0)

## 2020-11-10 LAB — RESP PANEL BY RT-PCR (FLU A&B, COVID) ARPGX2
Influenza A by PCR: NEGATIVE
Influenza B by PCR: NEGATIVE
SARS Coronavirus 2 by RT PCR: NEGATIVE

## 2020-11-10 MED ORDER — HYDRALAZINE HCL 20 MG/ML IJ SOLN: 5.0000 mg | INTRAMUSCULAR | Status: DC | PRN

## 2020-11-10 MED ORDER — SODIUM CHLORIDE 0.9 % IV SOLN: 250.0000 mL | INTRAVENOUS | Status: DC | PRN

## 2020-11-10 MED ORDER — CITALOPRAM HYDROBROMIDE 20 MG PO TABS
10.0000 mg | ORAL_TABLET | Freq: Every day | ORAL | Status: DC
  Administered 2020-11-11 – 2020-11-13 (×3): 10 mg via ORAL
  Filled 2020-11-10 (×3): qty 1

## 2020-11-10 MED ORDER — LORATADINE 10 MG PO TABS
10.0000 mg | ORAL_TABLET | Freq: Every day | ORAL | Status: DC
  Administered 2020-11-11 – 2020-11-13 (×3): 10 mg via ORAL
  Filled 2020-11-10 (×3): qty 1

## 2020-11-10 MED ORDER — MIRTAZAPINE 15 MG PO TABS
15.0000 mg | ORAL_TABLET | Freq: Every day | ORAL | Status: DC
  Administered 2020-11-10 – 2020-11-12 (×3): 15 mg via ORAL
  Filled 2020-11-10 (×3): qty 1

## 2020-11-10 MED ORDER — VITAMIN D3 25 MCG (1000 UNIT) PO TABS
1000.0000 [IU] | ORAL_TABLET | Freq: Every day | ORAL | Status: DC
  Administered 2020-11-11 – 2020-11-13 (×3): 1000 [IU] via ORAL
  Filled 2020-11-10 (×6): qty 1

## 2020-11-10 MED ORDER — MIRTAZAPINE 15 MG PO TABS: 15.0000 mg | ORAL_TABLET | Freq: Every day | ORAL | Status: DC

## 2020-11-10 MED ORDER — DM-GUAIFENESIN ER 30-600 MG PO TB12: 1.0000 | ORAL_TABLET | Freq: Two times a day (BID) | ORAL | Status: DC | PRN

## 2020-11-10 MED ORDER — GABAPENTIN 100 MG PO CAPS
100.0000 mg | ORAL_CAPSULE | Freq: Two times a day (BID) | ORAL | Status: DC
  Administered 2020-11-10 – 2020-11-13 (×6): 100 mg via ORAL
  Filled 2020-11-10 (×6): qty 1

## 2020-11-10 MED ORDER — FLUTICASONE PROPIONATE 50 MCG/ACT NA SUSP
1.0000 | Freq: Every day | NASAL | Status: DC
  Filled 2020-11-10: qty 16

## 2020-11-10 MED ORDER — OMEGA-3-ACID ETHYL ESTERS 1 G PO CAPS
1.0000 g | ORAL_CAPSULE | Freq: Every day | ORAL | Status: DC
  Administered 2020-11-11 – 2020-11-13 (×3): 1 g via ORAL
  Filled 2020-11-10 (×3): qty 1

## 2020-11-10 MED ORDER — METHYLPREDNISOLONE SODIUM SUCC 125 MG IJ SOLR
80.0000 mg | Freq: Once | INTRAMUSCULAR | Status: AC
  Administered 2020-11-10: 80 mg via INTRAVENOUS
  Filled 2020-11-10: qty 2

## 2020-11-10 MED ORDER — IPRATROPIUM-ALBUTEROL 0.5-2.5 (3) MG/3ML IN SOLN
3.0000 mL | RESPIRATORY_TRACT | Status: DC
  Administered 2020-11-10 – 2020-11-11 (×4): 3 mL via RESPIRATORY_TRACT
  Filled 2020-11-10 (×4): qty 3

## 2020-11-10 MED ORDER — BUTALBITAL-APAP-CAFFEINE 50-325-40 MG PO CAPS: 1.0000 | ORAL_CAPSULE | Freq: Every day | ORAL | Status: DC | PRN

## 2020-11-10 MED ORDER — ATORVASTATIN CALCIUM 20 MG PO TABS
10.0000 mg | ORAL_TABLET | Freq: Every day | ORAL | Status: DC
  Administered 2020-11-10 – 2020-11-12 (×3): 10 mg via ORAL
  Filled 2020-11-10 (×3): qty 1

## 2020-11-10 MED ORDER — ONDANSETRON HCL 4 MG/2ML IJ SOLN: 4.0000 mg | Freq: Three times a day (TID) | INTRAMUSCULAR | Status: DC | PRN

## 2020-11-10 MED ORDER — ASPIRIN EC 325 MG PO TBEC
325.0000 mg | DELAYED_RELEASE_TABLET | Freq: Every day | ORAL | Status: DC
  Administered 2020-11-11 – 2020-11-13 (×3): 325 mg via ORAL
  Filled 2020-11-10 (×3): qty 1

## 2020-11-10 MED ORDER — ADULT MULTIVITAMIN W/MINERALS CH
1.0000 | ORAL_TABLET | Freq: Every day | ORAL | Status: DC
  Administered 2020-11-11 – 2020-11-13 (×3): 1 via ORAL
  Filled 2020-11-10 (×3): qty 1

## 2020-11-10 MED ORDER — METHYLPREDNISOLONE SODIUM SUCC 40 MG IJ SOLR
40.0000 mg | Freq: Two times a day (BID) | INTRAMUSCULAR | Status: DC
  Administered 2020-11-10 – 2020-11-11 (×2): 40 mg via INTRAVENOUS
  Filled 2020-11-10 (×2): qty 1

## 2020-11-10 MED ORDER — ALBUTEROL SULFATE (2.5 MG/3ML) 0.083% IN NEBU: 2.5000 mg | INHALATION_SOLUTION | RESPIRATORY_TRACT | Status: DC | PRN

## 2020-11-10 MED ORDER — FUROSEMIDE 10 MG/ML IJ SOLN: 40.0000 mg | Freq: Once | INTRAMUSCULAR | Status: DC

## 2020-11-10 MED ORDER — BUTALBITAL-APAP-CAFFEINE 50-325-40 MG PO TABS: 1.0000 | ORAL_TABLET | Freq: Every day | ORAL | Status: DC | PRN

## 2020-11-10 MED ORDER — GLUCOSAMINE SULFATE 1000 MG PO CAPS: 1000.0000 mg | ORAL_CAPSULE | Freq: Two times a day (BID) | ORAL | Status: DC

## 2020-11-10 MED ORDER — PSYLLIUM 95 % PO PACK
1.0000 | PACK | Freq: Every day | ORAL | Status: DC
  Administered 2020-11-10 – 2020-11-12 (×3): 1 via ORAL
  Filled 2020-11-10 (×4): qty 1

## 2020-11-10 MED ORDER — AMLODIPINE BESYLATE 5 MG PO TABS
5.0000 mg | ORAL_TABLET | Freq: Every day | ORAL | Status: DC
  Administered 2020-11-11 – 2020-11-13 (×3): 5 mg via ORAL
  Filled 2020-11-10 (×3): qty 1

## 2020-11-10 MED ORDER — IPRATROPIUM-ALBUTEROL 0.5-2.5 (3) MG/3ML IN SOLN
3.0000 mL | Freq: Once | RESPIRATORY_TRACT | Status: AC
  Administered 2020-11-10: 3 mL via RESPIRATORY_TRACT
  Filled 2020-11-10: qty 3

## 2020-11-10 MED ORDER — FINASTERIDE 5 MG PO TABS
5.0000 mg | ORAL_TABLET | Freq: Every day | ORAL | Status: DC
  Administered 2020-11-10: 5 mg via ORAL
  Filled 2020-11-10: qty 1

## 2020-11-10 MED ORDER — SODIUM CHLORIDE 0.9% FLUSH
3.0000 mL | Freq: Two times a day (BID) | INTRAVENOUS | Status: DC
  Administered 2020-11-10 – 2020-11-13 (×7): 3 mL via INTRAVENOUS

## 2020-11-10 MED ORDER — SODIUM CHLORIDE 0.9% FLUSH: 3.0000 mL | INTRAVENOUS | Status: DC | PRN

## 2020-11-10 MED ORDER — ENOXAPARIN SODIUM 40 MG/0.4ML ~~LOC~~ SOLN: 40.0000 mg | SUBCUTANEOUS | Status: DC

## 2020-11-10 MED ORDER — ACETAMINOPHEN 325 MG PO TABS: 650.0000 mg | ORAL_TABLET | Freq: Four times a day (QID) | ORAL | Status: DC | PRN

## 2020-11-10 MED ORDER — METOPROLOL TARTRATE 50 MG PO TABS
50.0000 mg | ORAL_TABLET | Freq: Two times a day (BID) | ORAL | Status: DC
  Administered 2020-11-10 – 2020-11-13 (×4): 50 mg via ORAL
  Filled 2020-11-10 (×5): qty 1

## 2020-11-10 MED ORDER — ENOXAPARIN SODIUM 60 MG/0.6ML ~~LOC~~ SOLN
0.5000 mg/kg | SUBCUTANEOUS | Status: DC
  Administered 2020-11-10 – 2020-11-12 (×3): 47.5 mg via SUBCUTANEOUS
  Filled 2020-11-10 (×4): qty 0.6

## 2020-11-10 MED ORDER — FUROSEMIDE 10 MG/ML IJ SOLN
40.0000 mg | Freq: Two times a day (BID) | INTRAMUSCULAR | Status: DC
  Administered 2020-11-10 – 2020-11-11 (×2): 40 mg via INTRAVENOUS
  Filled 2020-11-10 (×2): qty 4

## 2020-11-10 NOTE — ED Notes (Signed)
Informed RN bed assigned 1410

## 2020-11-10 NOTE — H&P (Signed)
History and Physical    ABDULAHI Lara YDX:[id number] DOB: 21-May-1926 DOA: 11/10/2020  Referring MD/NP/PA:   PCP: Mick Sell, MD   Patient coming from:  The patient is coming from home.  At baseline, pt is independent for most of ADL.        Chief Complaint: SOB  HPI: Brian Lara is a 87 y.o. male with medical history significant of HTN, HLD, COPD, A fib not on AC, BPH, CKD-IIIa, depression, who presents with SOB.  Patient states that he has been having shortness of breath for more than 3 days, which has been progressively worsening, particularly exertion.  Patient has cough with clear mucus production.  No chest pain, fever or chills.  Patient does not have nausea, vomiting, diarrhea, abdominal pain, symptoms of UTI. He was found to have oxygen desaturated to 88% on room air, which improved to 93-95% on 2 L oxygen.  Patient is not wearing oxygen at home normally.  ED Course: pt was found to have BNP 599, troponin level 16, negative Covid PCR, stable renal function, temperature normal, blood pressure 167/82, heart rate 48, 109, 52, RR 24.  Chest x-ray showed cardiomegaly and pulmonary edema.  Patient is admitted to progressive bed as inpatient.  Dr. Lady Gary of cardiology is consulted   Review of Systems:   General: no fevers, chills, has poor appetite, has fatigue HEENT: no blurry vision, hearing changes or sore throat Respiratory: has dyspnea, coughing, wheezing CV: no chest pain, no palpitations GI: no nausea, vomiting, abdominal pain, diarrhea, constipation GU: no dysuria, burning on urination, increased urinary frequency, hematuria  Ext: has mild leg edema Neuro: no unilateral weakness, numbness, or tingling, no vision change or hearing loss Skin: no rash, no skin tear. MSK: No muscle spasm, no deformity, no limitation of range of movement in spin Heme: No easy bruising.  Travel history: No recent long distant travel.  Allergy:  Allergies  Allergen Reactions   . Codeine Other (See Comments)  . Morphine And Related Other (See Comments)    Past Medical History:  Diagnosis Date  . Atrial fibrillation (HCC)   . COPD (chronic obstructive pulmonary disease) (HCC)   . Hypercholesteremia   . Hypertension     Past Surgical History:  Procedure Laterality Date  . CHOLECYSTECTOMY    . REPLACEMENT TOTAL KNEE Bilateral     Social History:  reports that he has never smoked. He has never used smokeless tobacco. He reports previous drug use. He reports that he does not drink alcohol.  Family History: Brother had brain tumor  Prior to Admission medications   Medication Sig Start Date End Date Taking? Authorizing Provider  amLODipine (NORVASC) 5 MG tablet Take 5 mg by mouth daily.    [provider]  aspirin 325 MG tablet Take 325 mg by mouth daily.    [provider]  atorvastatin (LIPITOR) 10 MG tablet Take 10 mg by mouth daily.    [provider]  citalopram (CELEXA) 10 MG tablet Take 10 mg by mouth daily.    [provider]  finasteride (PROSCAR) 5 MG tablet Take 5 mg by mouth daily.    [provider]  fluticasone (FLONASE) 50 MCG/ACT nasal spray Place 1 spray into both nostrils daily.    [provider]    Physical Exam: Vitals:   11/10/20 1300 11/10/20 1330 11/10/20 1415 11/10/20 1500  BP: (!) 155/65 (!) 167/82 (!) 178/81 (!) 178/93  Pulse: (!) 56 (!) 52 Marland Kitchen)  56 (!) 58  Resp: (!) 22 13 19  (!) 24  Temp:   98.1 F (36.7 C)   TempSrc:   Oral   SpO2: 93% 93% 93% 91%  Weight:      Height:       General: Not in acute distress HEENT:       Eyes: PERRL, EOMI, no scleral icterus.       ENT: No discharge from the ears and nose, no pharynx injection, no tonsillar enlargement.        Neck: No JVD, no bruit, no mass felt. Heme: No neck lymph node enlargement. Cardiac: S1/S2, RRR, No murmurs, No gallops or rubs. Respiratory: has fine crackles and mild wheezing bilaterally, slightly decreased  air movement bilaterally GI: Soft, nondistended, nontender, no rebound pain, no organomegaly, BS present. GU: No hematuria Ext: has trace leg edema bilaterally. 1+DP/PT pulse bilaterally. Musculoskeletal: No joint deformities, No joint redness or warmth, no limitation of ROM in spin. Skin: No rashes.  Neuro: Alert, oriented X3, cranial nerves II-XII grossly intact, moves all extremities normally. Psych: Patient is not psychotic, no suicidal or hemocidal ideation.  Labs on Admission: I have personally reviewed following labs and imaging studies  CBC: Recent Labs  Lab 11/10/20 1120  WBC 8.6  NEUTROABS 5.7  HGB 13.6  HCT 41.6  MCV 95.2  PLT 157   Basic Metabolic Panel: Recent Labs  Lab 11/10/20 1120  NA 130*  K 4.3  CL 95*  CO2 25  GLUCOSE 128*  BUN 16  CREATININE 1.22  CALCIUM 9.2   GFR: Estimated Creatinine Clearance: 43.9 mL/min (by C-G formula based on SCr of 1.22 mg/dL). Liver Function Tests: No results for input(s): AST, ALT, ALKPHOS, BILITOT, PROT, ALBUMIN in the last 168 hours. No results for input(s): LIPASE, AMYLASE in the last 168 hours. No results for input(s): AMMONIA in the last 168 hours. Coagulation Profile: No results for input(s): INR, PROTIME in the last 168 hours. Cardiac Enzymes: No results for input(s): CKTOTAL, CKMB, CKMBINDEX, TROPONINI in the last 168 hours. BNP (last 3 results) No results for input(s): PROBNP in the last 8760 hours. HbA1C: No results for input(s): HGBA1C in the last 72 hours. CBG: No results for input(s): GLUCAP in the last 168 hours. Lipid Profile: No results for input(s): CHOL, HDL, LDLCALC, TRIG, CHOLHDL, LDLDIRECT in the last 72 hours. Thyroid Function Tests: No results for input(s): TSH, T4TOTAL, FREET4, T3FREE, THYROIDAB in the last 72 hours. Anemia Panel: No results for input(s): VITAMINB12, FOLATE, FERRITIN, TIBC, IRON, RETICCTPCT in the last 72 hours. Urine analysis:    Component Value Date/Time   COLORURINE  YELLOW (A) 01/17/2016 1945   APPEARANCEUR CLEAR (A) 01/17/2016 1945   LABSPEC 1.009 01/17/2016 1945   PHURINE 6.0 01/17/2016 1945   GLUCOSEU NEGATIVE 01/17/2016 1945   HGBUR NEGATIVE 01/17/2016 1945   BILIRUBINUR NEGATIVE 01/17/2016 1945   KETONESUR NEGATIVE 01/17/2016 1945   PROTEINUR NEGATIVE 01/17/2016 1945   NITRITE NEGATIVE 01/17/2016 1945   LEUKOCYTESUR NEGATIVE 01/17/2016 1945   Sepsis Labs: @LABRCNTIP (procalcitonin:4,lacticidven:4) ) Recent Results (from the past 240 hour(s))  Resp Panel by RT-PCR (Flu A&B, Covid) Nasopharyngeal Swab     Status: None   Collection Time: 11/10/20 12:02 PM   Specimen: Nasopharyngeal Swab; Nasopharyngeal(NP) swabs in vial transport medium  Result Value Ref Range Status   SARS Coronavirus 2 by RT PCR NEGATIVE NEGATIVE Final    Comment: (NOTE) SARS-CoV-2 target nucleic acids are NOT DETECTED.  The SARS-CoV-2 RNA is generally detectable in upper  respiratory specimens during the acute phase of infection. The lowest concentration of SARS-CoV-2 viral copies this assay can detect is 138 copies/mL. A negative result does not preclude SARS-Cov-2 infection and should not be used as the sole basis for treatment or other patient management decisions. A negative result may occur with  improper specimen collection/handling, submission of specimen other than nasopharyngeal swab, presence of viral mutation(s) within the areas targeted by this assay, and inadequate number of viral copies(<138 copies/mL). A negative result must be combined with clinical observations, patient history, and epidemiological information. The expected result is Negative.  Fact Sheet for Patients:  BloggerCourse.com  Fact Sheet for Healthcare Providers:  SeriousBroker.it  This test is no t yet approved or cleared by the [place] FDA and  has been authorized for detection and/or diagnosis of SARS-CoV-2 by FDA under an  Emergency Use Authorization (EUA). This EUA will remain  in effect (meaning this test can be used) for the duration of the COVID-19 declaration under Section 564(b)(1) of the Act, 21 U.S.C.section 360bbb-3(b)(1), unless the authorization is terminated  or revoked sooner.       Influenza A by PCR NEGATIVE NEGATIVE Final   Influenza B by PCR NEGATIVE NEGATIVE Final    Comment: (NOTE) The Xpert Xpress SARS-CoV-2/FLU/RSV plus assay is intended as an aid in the diagnosis of influenza from Nasopharyngeal swab specimens and should not be used as a sole basis for treatment. Nasal washings and aspirates are unacceptable for Xpert Xpress SARS-CoV-2/FLU/RSV testing.  Fact Sheet for Patients: BloggerCourse.com  Fact Sheet for Healthcare Providers: SeriousBroker.it  This test is not yet approved or cleared by the [place] FDA and has been authorized for detection and/or diagnosis of SARS-CoV-2 by FDA under an Emergency Use Authorization (EUA). This EUA will remain in effect (meaning this test can be used) for the duration of the COVID-19 declaration under Section 564(b)(1) of the Act, 21 U.S.C. section 360bbb-3(b)(1), unless the authorization is terminated or revoked.  Performed at [facility], [address] [place], [place] [address]      Radiological Exams on Admission: DG Chest 2 View  Result Date: 11/10/2020 CLINICAL DATA:  Worsening shortness of breath over past few days EXAM: CHEST - 2 VIEW COMPARISON:  04/30/2018 FINDINGS: Enlargement of cardiac silhouette with slight pulmonary vascular congestion. Mediastinal contours normal. Atherosclerotic calcification aorta. Scattered interstitial infiltrates throughout both lungs new since prior exam which may represent pulmonary edema or atypical infection. Subsegmental atelectasis at lingula. Probable small RIGHT pleural effusion. No pneumothorax or acute osseous findings.  IMPRESSION: Enlargement of cardiac silhouette with pulmonary vascular congestion and new interstitial infiltrates, favor pulmonary edema over atypical infection. Electronically Signed   By: Ulyses Southward M.D.   On: 11/10/2020 12:02     EKG: I have personally reviewed.  Seems to be in atrial fibrillation, QTC 436, heart rate 51, poor R wave progression, right bundle blockage   Assessment/Plan Active Problems:   Acute CHF (congestive heart failure) (HCC)   COPD exacerbation (HCC)   Hypertension   Hypercholesteremia   Atrial fibrillation (HCC)   Acute respiratory failure with hypoxia (HCC)   Depression   CKD (chronic kidney disease), stage IIIa  Acute respiratory failure with hypoxia due to combination of acute CHF and COPD exacerbation: Patient has leg edema, elevated BNP, pulmonary edema on chest x-ray, clinically consistent with CHF.  Patient has mild wheezing on auscultation, indicating COPD exacerbation.  -Admitted to progressive bed as inpatient -Bronchodilators -IV diuretics -Nasal cannula oxygen to  maintain oxygen saturation above 93%  Acute CHF (congestive heart failure): Not sure which type of CHF. 2D echo on 01/29/2016 showed EF> 55%, indicating possible acute diastolic CHF.  Consulted Dr. Lady GaryFath of cardiology -Lasix 40 mg bid by IV -2d echo -Daily weights -strict I/O's -Low salt diet -Fluid restriction -Obtain REDs Vest reading  COPD exacerbation (HCC) -Bronchodilators -As needed Mucinex -Solu-Medrol 40 mg twice daily -Incentive spirometry  Hypertension: -IV hydralazine as needed -Continue home metoprolol, amlodipine -Hold Dyazide since patient is on IV Lasix  Hypercholesteremia -Lipitor  Atrial fibrillation (HCC): Patient is not taking at that coagulants currently -Continue metoprolol  Depression -Continue home Celexa General  CKD (chronic kidney disease), stage IIIa: Stable at baseline -Follow-up with BMP    DVT ppx:  SQ Lovenox Code Status: DNR per  pt and his granddaughter Family Communication:  Yes, patient's Granddaughter    at bed side Disposition Plan:  Anticipate discharge back to previous environment Consults called:  Dr. Lady GaryFath of card Admission status and Level of care: Progressive Cardiac:    as inpt       Status is: Inpatient  Remains inpatient appropriate because:Inpatient level of care appropriate due to severity of illness   Dispo: The patient is from: Home              Anticipated d/c is to: Home              Patient currently is not medically stable to d/c.   Difficult to place patient No          Date of Service 11/10/2020    Lorretta HarpXilin Skyllar Notarianni [facility]   If 7PM-7AM, please contact night-coverage [email or web address] 11/10/2020, 4:03 PM

## 2020-11-10 NOTE — Plan of Care (Signed)
  Problem: Education: Goal: Knowledge of General Education information will improve Description: Including pain rating scale, medication(s)/side effects and non-pharmacologic comfort measures Outcome: Progressing Note: Patient diuresing without any complications. Patient is dyspneic with minimal exertion.

## 2020-11-10 NOTE — Progress Notes (Signed)
Anticoagulation monitoring(Lovenox):  87yo  F ordered Lovenox 40 mg Q24h  Filed Weights   11/10/20 1115  Weight: 94.9 kg (209 lb 4.8 oz)   BMI 30.91   Lab Results  Component Value Date   CREATININE 1.22 11/10/2020   CREATININE 1.13 04/30/2018   CREATININE 1.06 01/17/2016   Estimated Creatinine Clearance: 43.9 mL/min (by C-G formula based on SCr of 1.22 mg/dL). Hemoglobin & Hematocrit     Component Value Date/Time   HGB 13.6 11/10/2020 1120   HCT 41.6 11/10/2020 1120     Per Protocol for Patient with estCrcl > 30 ml/min and BMI > 30, will transition to Lovenox 0.5 mg/kg Q24h.      Bari Mantis PharmD Clinical Pharmacist 11/10/2020

## 2020-11-10 NOTE — Consult Note (Signed)
Cardiology Consultation Note    [name], MRN: [id number], DOB/AGE: 85/04/1926 85 y.o. Admit date: 11/10/2020   Date of Consult: 11/10/2020 Primary Physician: Mick Sell, MD Primary Cardiologist: Dr. Lady Gary  Chief Complaint: sob Reason for Consultation: Dante Gang Requesting MD: Dr. Clyde Lundborg  HPI: Brian Lara is a 87 y.o. male with history of atrial fibrillation, hypertension, reactive airway disease. He presented to the er with complaints of sob.Had bilateral lower extremity edema. Pulse ox was 88 on room air. Troponin was negative. BNP mildly elevated to 599.  CXR mild pulmonary edema. Echo in 2017 normal. He has noted several days of increaseing shortness of breath and lower extremity edema. Worsened on day of admission prompting his presentation. He feels better since being admitted and given diuretics. EKG shows probable sinus arrhythmia although p waves are somewhat difficult to assess in some leads. Has had afib in the past but has deferred anticoagulation. Has remained on asa and metoprolol.     Past Medical History:  Diagnosis Date  . Atrial fibrillation (HCC)   . COPD (chronic obstructive pulmonary disease) (HCC)   . Hypercholesteremia   . Hypertension       Surgical History:  Past Surgical History:  Procedure Laterality Date  . CHOLECYSTECTOMY    . REPLACEMENT TOTAL KNEE Bilateral      Home Meds: Prior to Admission medications   Medication Sig Start Date End Date Taking? Authorizing Provider  amLODipine (NORVASC) 5 MG tablet Take 5 mg by mouth daily.   Yes [provider]  aspirin 325 MG tablet Take 325 mg by mouth daily.   Yes [provider]  atorvastatin (LIPITOR) 10 MG tablet Take 10 mg by mouth daily.   Yes [provider]  Cholecalciferol 25 MCG (1000 UT) capsule Take 1,000 Units by mouth daily.   Yes [provider]  citalopram (CELEXA) 10 MG tablet Take 10 mg by mouth daily.   Yes [provider]   fexofenadine (ALLEGRA) 180 MG tablet Take 60 mg by mouth daily.   Yes [provider]  finasteride (PROSCAR) 5 MG tablet Take 5 mg by mouth daily.   Yes [provider]  gabapentin (NEURONTIN) 100 MG capsule Take 100 mg by mouth 2 (two) times daily. 09/12/20  Yes [provider]  Glucosamine Sulfate 1000 MG CAPS Take 1 tablet by mouth 2 (two) times daily.   Yes [provider]  metoprolol tartrate (LOPRESSOR) 50 MG tablet Take 50 mg by mouth 2 (two) times daily. 08/17/20  Yes [provider]  mirtazapine (REMERON) 15 MG tablet Take 15 mg by mouth at bedtime. 11/02/20  Yes [provider]  Multiple Vitamin (MULTI-VITAMIN) tablet Take 1 tablet by mouth daily.   Yes [provider]  Omega-3 Fatty Acids (FISH OIL) 1000 MG CPDR Take 1 capsule by mouth in the morning and at bedtime.   Yes [provider]  psyllium (METAMUCIL) 58.6 % packet Take 1 packet by mouth daily.   Yes [provider]  triamterene-hydrochlorothiazide (DYAZIDE) 37.5-25 MG capsule Take 1 capsule by mouth daily. 08/17/20  Yes [provider]  Butalbital-APAP-Caffeine [phone] MG capsule Take 1 capsule by mouth daily as needed for headache. 08/17/19   [provider]  fluticasone (FLONASE) 50 MCG/ACT nasal spray Place 1 spray into both nostrils daily.    [provider]    Inpatient Medications:  . [START ON 11/11/2020] amLODipine  5 mg Oral Daily  . [START ON  11/11/2020] aspirin EC  325 mg Oral Daily  . atorvastatin  10 mg Oral Daily  . [START ON 11/11/2020] Cholecalciferol  1,000 Units Oral Daily  . [START ON 11/11/2020] citalopram  10 mg Oral Daily  . enoxaparin (LOVENOX) injection  0.5 mg/kg Subcutaneous Q24H  . finasteride  5 mg Oral Daily  . fluticasone  1 spray Each Nare Daily  . furosemide  40 mg Intravenous BID  . gabapentin  100 mg Oral BID  . Glucosamine Sulfate  1,000 mg Oral BID  . ipratropium-albuterol  3 mL  Nebulization Q4H  . [START ON 11/11/2020] loratadine  10 mg Oral Daily  . methylPREDNISolone (SOLU-MEDROL) injection  40 mg Intravenous Q12H  . metoprolol tartrate  50 mg Oral BID  . mirtazapine  15 mg Oral QHS  . [START ON 11/11/2020] Multi-Vitamin  1 tablet Oral Daily  . [START ON 11/11/2020] omega-3 acid ethyl esters  1 g Oral Daily  . psyllium  1 packet Oral Daily  . sodium chloride flush  3 mL Intravenous Q12H   . sodium chloride      Allergies:  Allergies  Allergen Reactions  . Codeine Other (See Comments)  . Morphine And Related Other (See Comments)    Social History   Socioeconomic History  . Marital status: Widowed    Spouse name: Not on file  . Number of children: Not on file  . Years of education: Not on file  . Highest education level: Not on file  Occupational History  . Not on file  Tobacco Use  . Smoking status: Never Smoker  . Smokeless tobacco: Never Used  Substance and Sexual Activity  . Alcohol use: Never  . Drug use: Not Currently  . Sexual activity: Not on file  Other Topics Concern  . Not on file  Social History Narrative  . Not on file   Social Determinants of Health   Financial Resource Strain: Not on file  Food Insecurity: Not on file  Transportation Needs: Not on file  Physical Activity: Not on file  Stress: Not on file  Social Connections: Not on file  Intimate Partner Violence: Not on file     No family history on file.   Review of Systems: A 12-system review of systems was performed and is negative except as noted in the HPI.  Labs: No results for input(s): CKTOTAL, CKMB, TROPONINI in the last 72 hours. Lab Results  Component Value Date   WBC 8.6 11/10/2020   HGB 13.6 11/10/2020   HCT 41.6 11/10/2020   MCV 95.2 11/10/2020   PLT 157 11/10/2020    Recent Labs  Lab 11/10/20 1120  NA 130*  K 4.3  CL 95*  CO2 25  BUN 16  CREATININE 1.22  CALCIUM 9.2  GLUCOSE 128*   No results found for: CHOL, HDL, LDLCALC, TRIG No  results found for: DDIMER  Radiology/Studies:  DG Chest 2 View  Result Date: 11/10/2020 CLINICAL DATA:  Worsening shortness of breath over past few days EXAM: CHEST - 2 VIEW COMPARISON:  04/30/2018 FINDINGS: Enlargement of cardiac silhouette with slight pulmonary vascular congestion. Mediastinal contours normal. Atherosclerotic calcification aorta. Scattered interstitial infiltrates throughout both lungs new since prior exam which may represent pulmonary edema or atypical infection. Subsegmental atelectasis at lingula. Probable small RIGHT pleural effusion. No pneumothorax or acute osseous findings. IMPRESSION: Enlargement of cardiac silhouette with pulmonary vascular congestion and new interstitial infiltrates, favor pulmonary edema over atypical infection. Electronically Signed   By: Ulyses Southward  M.D.   On: 11/10/2020 12:02    Wt Readings from Last 3 Encounters:  11/10/20 94.9 kg  04/30/18 89.4 kg  01/17/16 89.8 kg    EKG: prbable sinus arrhythmia. No ischemia  Physical Exam:  Blood pressure (!) 178/81, pulse (!) 56, temperature 98.1 F (36.7 C), temperature source Oral, resp. rate 19, height 5\' 9"  (1.753 m), weight 94.9 kg, SpO2 93 %. Body mass index is 30.91 kg/m. General: Well developed, well nourished, in no acute distress. Head: Normocephalic, atraumatic, sclera non-icteric, no xanthomas, nares are without discharge.  Neck: Negative for carotid bruits. JVD not elevated. Lungs: Clear bilaterally to auscultation without wheezes, rales, or rhonchi. Breathing is unlabored. Heart: RRR with S1 S2. No murmurs, rubs, or gallops appreciated. Abdomen: Soft, non-tender, non-distended with normoactive bowel sounds. No hepatomegaly. No rebound/guarding. No obvious abdominal masses. Msk:  Strength and tone appear normal for age. Extremities: No clubbing or cyanosis. 2+ edema bilaterally Neuro: Alert and oriented X 3. No facial asymmetry. No focal deficit. Moves all extremities  spontaneously. Psych:  Responds to questions appropriately with a normal affect.     Assessment and Plan  87 yo with history of paroxysmal afib and hypertension admitted with peripheral edema and sob. Mild pulmonary edema on cxr.   1. CHF-had preserved lv funciton in the past. Will review echo durring this admission when available. Continue careful diuresis following renal function and electrolytes.  2arrhythmia-ekg shows probalbe sinus arrythmia but has had paroxysmall afib in he past and has been controlled with metoprolol and has deferred chronic anticoagulation. Will follow.  Signed, 99 MD 11/10/2020, 3:30 PM Pager: [phone]

## 2020-11-10 NOTE — ED Provider Notes (Signed)
[facility] Emergency Department Provider Note    Event Date/Time   First MD Initiated Contact with Patient 11/10/20 1107     (approximate)  I have reviewed the triage vital signs and the nursing notes.   HISTORY  Chief Complaint Shortness of Breath    HPI Brian Lara is a 87 y.o. male below listed past medical history presents to the ER for evaluation of shortness of breath.  Symptoms started a few days ago.  Is not had any productive cough.  He does not wear home oxygen.  Denies any chest pain.  Denies any orthopnea.  Does have exertional dyspnea as well.  Found to be hypoxic to 88% on room air was placed on 2 L nasal cannula upon arrival.  No recent antibiotics.  No known sick contacts.    Past Medical History:  Diagnosis Date  . Atrial fibrillation (HCC)   . COPD (chronic obstructive pulmonary disease) (HCC)   . Hypercholesteremia   . Hypertension    No family history on file. Past Surgical History:  Procedure Laterality Date  . CHOLECYSTECTOMY    . REPLACEMENT TOTAL KNEE Bilateral    There are no problems to display for this patient.     Prior to Admission medications   Medication Sig Start Date End Date Taking? Authorizing Provider  amLODipine (NORVASC) 5 MG tablet Take 5 mg by mouth daily.    [provider]  aspirin 325 MG tablet Take 325 mg by mouth daily.    [provider]  atorvastatin (LIPITOR) 10 MG tablet Take 10 mg by mouth daily.    [provider]  citalopram (CELEXA) 10 MG tablet Take 10 mg by mouth daily.    [provider]  finasteride (PROSCAR) 5 MG tablet Take 5 mg by mouth daily.    [provider]  fluticasone (FLONASE) 50 MCG/ACT nasal spray Place 1 spray into both nostrils daily.    [provider]    Allergies Codeine and Morphine and related    Social History Social History   Tobacco Use  . Smoking status: Never Smoker  . Smokeless tobacco: Never  Used  Substance Use Topics  . Alcohol use: Never  . Drug use: Not Currently    Review of Systems Patient denies headaches, rhinorrhea, blurry vision, numbness, shortness of breath, chest pain, edema, cough, abdominal pain, nausea, vomiting, diarrhea, dysuria, fevers, rashes or hallucinations unless otherwise stated above in HPI. ____________________________________________   PHYSICAL EXAM:  VITAL SIGNS: Vitals:   11/10/20 1300 11/10/20 1330  BP: (!) 155/65 (!) 167/82  Pulse: (!) 56 (!) 52  Resp: (!) 22 13  Temp:    SpO2: 93% 93%    Constitutional: Alert and oriented.  Eyes: Conjunctivae are normal.  Head: Atraumatic. Nose: No congestion/rhinnorhea. Mouth/Throat: Mucous membranes are moist.   Neck: No stridor. Painless ROM.  Cardiovascular: Normal rate, regular rhythm. Grossly normal heart sounds.  Good peripheral circulation. Respiratory: Mild tachypnea with evidence of acute respiratory failure with hypoxia requiring supplemental oxygen.  Does have faint diminished breath sounds and expiratory wheeze throughout.  Protecting his airway Gastrointestinal: Soft and nontender. No distention. No abdominal bruits. No CVA tenderness. Genitourinary:  Musculoskeletal: No lower extremity tenderness nor edema.  No joint effusions. Neurologic:  Normal speech and language. No gross focal neurologic deficits are appreciated. No facial droop Skin:  Skin is warm, dry and intact. No rash noted. Psychiatric: Mood and affect are normal. Speech and behavior are normal.  ____________________________________________   LABS (all labs ordered are listed, but only abnormal results are displayed)  Results for orders placed or performed during the hospital encounter of 11/10/20 (from the past 24 hour(s))  Basic metabolic panel     Status: Abnormal   Collection Time: 11/10/20 11:20 AM  Result Value Ref Range   Sodium 130 (L) 135 - 145 mmol/L   Potassium 4.3 3.5 - 5.1 mmol/L   Chloride 95 (L) 98  - 111 mmol/L   CO2 25 22 - 32 mmol/L   Glucose, Bld 128 (H) 70 - 99 mg/dL   BUN 16 8 - 23 mg/dL   Creatinine, Ser 2.87 0.61 - 1.24 mg/dL   Calcium 9.2 8.9 - 86.7 mg/dL   GFR, Estimated 56 (L) >60 mL/min   Anion gap 10 5 - 15  CBC with Differential     Status: None   Collection Time: 11/10/20 11:20 AM  Result Value Ref Range   WBC 8.6 4.0 - 10.5 K/uL   RBC 4.37 4.22 - 5.81 MIL/uL   Hemoglobin 13.6 13.0 - 17.0 g/dL   HCT 67.2 09.4 - 70.9 %   MCV 95.2 80.0 - 100.0 fL   MCH 31.1 26.0 - 34.0 pg   MCHC 32.7 30.0 - 36.0 g/dL   RDW 62.8 36.6 - 29.4 %   Platelets 157 150 - 400 K/uL   nRBC 0.0 0.0 - 0.2 %   Neutrophils Relative % 66 %   Neutro Abs 5.7 1.7 - 7.7 K/uL   Lymphocytes Relative 25 %   Lymphs Abs 2.2 0.7 - 4.0 K/uL   Monocytes Relative 7 %   Monocytes Absolute 0.6 0.1 - 1.0 K/uL   Eosinophils Relative 1 %   Eosinophils Absolute 0.1 0.0 - 0.5 K/uL   Basophils Relative 1 %   Basophils Absolute 0.1 0.0 - 0.1 K/uL   Immature Granulocytes 0 %   Abs Immature Granulocytes 0.03 0.00 - 0.07 K/uL  Troponin I (High Sensitivity)     Status: None   Collection Time: 11/10/20 11:20 AM  Result Value Ref Range   Troponin I (High Sensitivity) 16 <18 ng/L  Brain natriuretic peptide     Status: Abnormal   Collection Time: 11/10/20 11:20 AM  Result Value Ref Range   B Natriuretic Peptide 599.6 (H) 0.0 - 100.0 pg/mL  Resp Panel by RT-PCR (Flu A&B, Covid) Nasopharyngeal Swab     Status: None   Collection Time: 11/10/20 12:02 PM   Specimen: Nasopharyngeal Swab; Nasopharyngeal(NP) swabs in vial transport medium  Result Value Ref Range   SARS Coronavirus 2 by RT PCR NEGATIVE NEGATIVE   Influenza A by PCR NEGATIVE NEGATIVE   Influenza B by PCR NEGATIVE NEGATIVE   ____________________________________________  EKG My review and personal interpretation at Time:  11:18  Indication: sob  Rate: 50  Rhythm: sinus Axis: left Other: normal intervals, no stemi, nonspecific st  abn ____________________________________________  RADIOLOGY  I personally reviewed all radiographic images ordered to evaluate for the above acute complaints and reviewed radiology reports and findings.  These findings were personally discussed with the patient.  Please see medical record for radiology report.  ____________________________________________   PROCEDURES  Procedure(s) performed:  .Critical Care Performed by: Willy Eddy, MD Authorized by: Willy Eddy, MD   Critical care provider statement:    Critical care time (minutes):  35   Critical care time was exclusive of:  Separately billable procedures and treating other patients   Critical care was necessary to treat  or prevent imminent or life-threatening deterioration of the following conditions:  Respiratory failure   Critical care was time spent personally by me on the following activities:  Development of treatment plan with patient or surrogate, discussions with consultants, evaluation of patient's response to treatment, examination of patient, obtaining history from patient or surrogate, ordering and performing treatments and interventions, ordering and review of laboratory studies, ordering and review of radiographic studies, pulse oximetry, re-evaluation of patient's condition and review of old charts      Critical Care performed: yes ____________________________________________   INITIAL IMPRESSION / ASSESSMENT AND PLAN / ED COURSE  Pertinent labs & imaging results that were available during my care of the patient were reviewed by me and considered in my medical decision making (see chart for details).   DDX: Asthma, copd, CHF, pna, ptx, malignancy, Pe, anemia   Brian Lara is a 87 y.o. who presents to the ED with presentation as described above.  Patient with evidence of acute respiratory failure with hypoxia.  Does have faint wheezing on exam concerning for a component of COPD but also has  lower extremity edema possible new onset CHF  She denies any pain or discomfort right now.  Hypoxia improved after 2 L nasal cannula supplemental oxygen.  The patient will be placed on continuous pulse oximetry and telemetry for monitoring.  Laboratory evaluation will be sent to evaluate for the above complaints.     Clinical Course as of 11/10/20 1348  Fri Nov 10, 2020  1346 BNP is elevated therefore have a higher suspicion for new onset congestive heart failure.  Did not feel significant change after nebs.  Will give IV Lasix will discuss with hospitalist for admission for echo and further evaluation management. [PR]    Clinical Course User Index [PR] Willy Eddy, MD    The patient was evaluated in Emergency Department today for the symptoms described in the history of present illness. He/she was evaluated in the context of the global COVID-19 pandemic, which necessitated consideration that the patient might be at risk for infection with the SARS-CoV-2 virus that causes COVID-19. Institutional protocols and algorithms that pertain to the evaluation of patients at risk for COVID-19 are in a state of rapid change based on information released by regulatory bodies including the CDC and federal and state organizations. These policies and algorithms were followed during the patient's care in the ED.  As part of my medical decision making, I reviewed the following data within the electronic MEDICAL RECORD NUMBER Nursing notes reviewed and incorporated, Labs reviewed, notes from prior ED visits and [place] Controlled Substance Database   ____________________________________________   FINAL CLINICAL IMPRESSION(S) / ED DIAGNOSES  Final diagnoses:  Acute respiratory failure with hypoxia (HCC)      NEW MEDICATIONS STARTED DURING THIS VISIT:  New Prescriptions   No medications on file     Note:  This document was prepared using Dragon voice recognition software and may include unintentional  dictation errors.    Willy Eddy, MD 11/10/20 1349

## 2020-11-10 NOTE — ED Triage Notes (Signed)
Pt to ED via [facility] for [facility] that has gotten worse over the last few days, pt states that his shortness of breath is worse with movement. Pt denies fever of chills. Per EMS when fire arrived on scene, pts SpO2 was 88% on room air. Pt was placed on 2 liters of O2 via  which brought SpO2 up to 96-97%. Pt does not normally wear oxygen at home. Pt denies any pain at this time. Per EMS pt family reports that he has been retaining fluid in his ankle. Pt does have non-pitting edema in bilateral ankles and states that this is not normal for him. EMS reports pts HR was between 40's-50's during transport and pt states that this is normal for him. Pt noted to have increased work of breathing with movement. Hx/o COPD, Afib

## 2020-11-10 NOTE — ED Notes (Signed)
Pharmacy at bedside for medication reconciliation.  

## 2020-11-11 DIAGNOSIS — I5031 Acute diastolic (congestive) heart failure: Secondary | ICD-10-CM

## 2020-11-11 LAB — BASIC METABOLIC PANEL
Anion gap: 11 (ref 5–15)
BUN: 17 mg/dL (ref 8–23)
CO2: 25 mmol/L (ref 22–32)
Calcium: 8.8 mg/dL — ABNORMAL LOW (ref 8.9–10.3)
Chloride: 92 mmol/L — ABNORMAL LOW (ref 98–111)
Creatinine, Ser: 1.34 mg/dL — ABNORMAL HIGH (ref 0.61–1.24)
GFR, Estimated: 50 mL/min — ABNORMAL LOW (ref >60)
Glucose, Bld: 144 mg/dL — ABNORMAL HIGH (ref 70–99)
Potassium: 4.1 mmol/L (ref 3.5–5.1)
Sodium: 128 mmol/L — ABNORMAL LOW (ref 135–145)

## 2020-11-11 LAB — CBC
HCT: 40.3 % (ref 39.0–52.0)
Hemoglobin: 13.4 g/dL (ref 13.0–17.0)
MCH: 31.5 pg (ref 26.0–34.0)
MCHC: 33.3 g/dL (ref 30.0–36.0)
MCV: 94.6 fL (ref 80.0–100.0)
Platelets: 156 10*3/uL (ref 150–400)
RBC: 4.26 MIL/uL (ref 4.22–5.81)
RDW: 13.4 % (ref 11.5–15.5)
WBC: 6 10*3/uL (ref 4.0–10.5)
nRBC: 0 % (ref 0.0–0.2)

## 2020-11-11 LAB — MAGNESIUM: Magnesium: 1.9 mg/dL (ref 1.7–2.4)

## 2020-11-11 MED ORDER — IPRATROPIUM-ALBUTEROL 0.5-2.5 (3) MG/3ML IN SOLN
3.0000 mL | Freq: Four times a day (QID) | RESPIRATORY_TRACT | Status: DC
  Administered 2020-11-11 – 2020-11-12 (×5): 3 mL via RESPIRATORY_TRACT
  Filled 2020-11-11 (×5): qty 3

## 2020-11-11 MED ORDER — POLYETHYLENE GLYCOL 3350 17 G PO PACK
17.0000 g | PACK | Freq: Every day | ORAL | Status: DC
  Administered 2020-11-11 – 2020-11-13 (×3): 17 g via ORAL
  Filled 2020-11-11 (×3): qty 1

## 2020-11-11 MED ORDER — FUROSEMIDE 10 MG/ML IJ SOLN
20.0000 mg | Freq: Two times a day (BID) | INTRAMUSCULAR | Status: DC
  Administered 2020-11-11 – 2020-11-12 (×2): 20 mg via INTRAVENOUS
  Filled 2020-11-11 (×2): qty 2

## 2020-11-11 NOTE — Progress Notes (Signed)
[facility]  - [place] at [facility]   PATIENT NAME: Brian Lara    MR#:  [id number]  DATE OF BIRTH:  Apr 17, 1926  SUBJECTIVE:  patient came in from his independent living facility with increasing shortness of breath. Found to be in congestive heart failure. Overnight followed better. Family at bedside. Patient denies any chest pain. Seems to be improving.  REVIEW OF SYSTEMS:   Review of Systems  Constitutional: Negative for chills, fever and weight loss.  HENT: Negative for ear discharge, ear pain and nosebleeds.   Eyes: Negative for blurred vision, pain and discharge.  Respiratory: Positive for shortness of breath. Negative for sputum production, wheezing and stridor.   Cardiovascular: Negative for chest pain, palpitations, orthopnea and PND.  Gastrointestinal: Negative for abdominal pain, diarrhea, nausea and vomiting.  Genitourinary: Negative for frequency and urgency.  Musculoskeletal: Negative for back pain and joint pain.  Neurological: Positive for weakness. Negative for sensory change, speech change and focal weakness.  Psychiatric/Behavioral: Negative for depression and hallucinations. The patient is not nervous/anxious.    Tolerating Diet:yes Tolerating PT:   DRUG ALLERGIES:   Allergies  Allergen Reactions  . Codeine Other (See Comments)  . Morphine And Related Other (See Comments)    VITALS:  Blood pressure (!) 159/84, pulse 62, temperature 98.3 F (36.8 C), temperature source Oral, resp. rate 18, height 5\' 9"  (1.753 m), weight 91.5 kg, SpO2 98 %.  PHYSICAL EXAMINATION:   Physical Exam  GENERAL:  87 y.o.-year-old patient lying in the bed with no acute distress.  LUNGS: Normal breath sounds bilaterally, no wheezing, rales, rhonchi. No use of accessory muscles of respiration.  CARDIOVASCULAR: S1, S2 normal. No murmurs, rubs, or gallops.  ABDOMEN: Soft, nontender, nondistended. Bowel sounds present. No organomegaly or mass.  EXTREMITIES: No  cyanosis, clubbing  +edema b/l.    NEUROLOGIC: Cranial nerves II through XII are intact. No focal Motor or sensory deficits b/l.   PSYCHIATRIC:  patient is alert and oriented x 3.  SKIN: No obvious rash, lesion, or ulcer.   LABORATORY PANEL:  CBC Recent Labs  Lab 11/11/20 0516  WBC 6.0  HGB 13.4  HCT 40.3  PLT 156    Chemistries  Recent Labs  Lab 11/11/20 0516  NA 128*  K 4.1  CL 92*  CO2 25  GLUCOSE 144*  BUN 17  CREATININE 1.34*  CALCIUM 8.8*  MG 1.9   Cardiac Enzymes No results for input(s): TROPONINI in the last 168 hours. RADIOLOGY:  DG Chest 2 View  Result Date: 11/10/2020 CLINICAL DATA:  Worsening shortness of breath over past few days EXAM: CHEST - 2 VIEW COMPARISON:  04/30/2018 FINDINGS: Enlargement of cardiac silhouette with slight pulmonary vascular congestion. Mediastinal contours normal. Atherosclerotic calcification aorta. Scattered interstitial infiltrates throughout both lungs new since prior exam which may represent pulmonary edema or atypical infection. Subsegmental atelectasis at lingula. Probable small RIGHT pleural effusion. No pneumothorax or acute osseous findings. IMPRESSION: Enlargement of cardiac silhouette with pulmonary vascular congestion and new interstitial infiltrates, favor pulmonary edema over atypical infection. Electronically Signed   By: 05/02/2018 M.D.   On: 11/10/2020 12:02   ASSESSMENT AND PLAN:  Brian Lara is a 87 y.o. male with medical history significant of HTN, HLD, COPD, A fib not on AC, BPH, CKD-IIIa, depression, who presents with SOB. Patient states that he has been having shortness of breath for more than 3 days, which has been progressively worsening, particularly exertion.  Patient has cough with clear mucus  production  Acute respiratory failure with hypoxia due to combination of acute CHF and COPD exacerbation: Patient has leg edema, elevated BNP, pulmonary edema on chest x-ray, clinically consistent with CHF.   Patient has mild wheezing on auscultation, indicating COPD exacerbation. -Bronchodilators -IV diuretics--change to 20 mg bid -Nasal cannula oxygen to maintain oxygen saturation above 93%--wean as tolerated  Acute CHF (congestive heart failure): suspected diastolic -- echo on 01/29/2016 showed EF> 55%, indicating possible acute diastolic CHF.  Consulted Dr. Lady Gary of cardiology -Lasix 20 mg bid by IV -Daily weights -strict I/O's -Low salt diet -Fluid restriction  COPD exacerbation (HCC) -Bronchodilators -As needed Mucinex -Solu-Medrol 40 mg twice daily--d/c it.pt not wheezing. Symptoms more due to CHF -Incentive spirometry  Hypertension: -IV hydralazine as needed -Continue home metoprolol, amlodipine -Hold Dyazide since patient is on IV Lasix  Hypercholesteremia -Lipitor  Atrial fibrillation [facility]): Patient is not taking at that coagulants currently -Continue metoprolol  Depression -Continue home Celexa   CKD (chronic kidney disease), stage IIIa: --creat up to 1.34--decreased dose of lasix   DVT ppx:  SQ Lovenox Code Status: DNR per pt and his granddaughter Family Communication:  Yes, patient's Granddaughter and son    at bed side Disposition Plan:  Anticipate discharge back to previous environment Consults called:  Dr. Lady Gary of card Admission status and Level of care: Progressive Cardiac:    as inpt       Status is: Inpatient  Remains inpatient appropriate because:Inpatient level of care appropriate due to severity of illness   Dispo: The patient is from: Home  Anticipated d/c is to: Home  Patient currently is not medically stable to d/c.              Difficult to place patient No  continue management of CHF. Physical therapy to see patient. Discharge hopefully in 1 to 2 days        TOTAL TIME TAKING CARE OF THIS PATIENT: *25* minutes.  >50% time spent on counselling and coordination of care  Note: This dictation was  prepared with Dragon dictation along with smaller phrase technology. Any transcriptional errors that result from this process are unintentional.  Enedina Finner M.D    [facility]   CC: Primary care physician; Mick Sell, MDPatient ID: Brian Lara, male   DOB: 10-29-1925, 87 y.o.   MRN: [id number]

## 2020-11-11 NOTE — Progress Notes (Signed)
Mobility Specialist - Progress Note   11/11/20 1348  Mobility  Activity Ambulated in room  Level of Assistance Moderate assist, patient does 50-74%  Assistive Device Front wheel walker  Distance Ambulated (ft) 16 ft  Mobility Response Tolerated fair  Mobility performed by Mobility specialist  $Mobility charge 1 Mobility     Pt lying in bed utilizing 2L. Agrees to trial activity on RA. Discontinued O2 use and O2 desat from 94% to 90% while still supine. Pt sat EOB and c/o dizziness that did resolve with rest break. Pt stood with modA, successful on 2nd attempt with extra time. Voiced weakness in BLE while upright, participated in therex to prepare for ambulation: marching and heel raises. Pt then ambulated 8' with significant LOB x2, corrected by CGA follow. Chair provided, extensive rest break. Pt voiced return of dizziness, but worsening with ambulation. Further activity limited. BP measured 158/66 (94). O2 94% during ambulation RA. Pt ambulated back to bedside with no LOB, much improved from 1st attempt. O2 desat to a low of 88% once returned supine, voicing SOB. PLB educated and used. [place] reapplied on 2L to rebound sats to 94% prior to exit. Nurse notified of performance.    No O2 use PTA. Lives alone in single-story home, no stairs to entry. Children close by that visit frequently.   Brian Lara Mobility Specialist 11/11/20, 2:04 PM

## 2020-11-11 NOTE — Progress Notes (Signed)
Patient Name: Brian Lara Date of Encounter: 11/11/2020  Hospital Problem List     Active Problems:   Acute CHF (congestive heart failure) (HCC)   COPD exacerbation (HCC)   Hypertension   Hypercholesteremia   Atrial fibrillation (HCC)   Acute respiratory failure with hypoxia (HCC)   Depression   CKD (chronic kidney disease), stage IIIa    Patient Profile     87 y.o. male with history of atrial fibrillation, hypertension, reactive airway disease. He presented to the er with complaints of sob.Had bilateral lower extremity edema. Pulse ox was 88 on room air. Troponin was negative. BNP mildly elevated to 599.  CXR mild pulmonary edema. Echo in 2017 normal. He has noted several days of increaseing shortness of breath and lower extremity edema. Worsened on day of admission prompting his presentation. He feels better since being admitted and given diuretics. EKG shows probable sinus arrhythmia although p waves are somewhat difficult to assess in some leads. Has had afib in the past but has deferred anticoagulation. Has remained on asa and metoprolol.   Subjective   Less edema. Has diuresed 205 cc  Inpatient Medications    . amLODipine  5 mg Oral Daily  . aspirin EC  325 mg Oral Daily  . atorvastatin  10 mg Oral QHS  . cholecalciferol  1,000 Units Oral Daily  . citalopram  10 mg Oral Daily  . enoxaparin (LOVENOX) injection  0.5 mg/kg Subcutaneous Q24H  . fluticasone  1 spray Each Nare Daily  . furosemide  20 mg Intravenous BID  . gabapentin  100 mg Oral BID  . ipratropium-albuterol  3 mL Nebulization Q6H  . loratadine  10 mg Oral Daily  . methylPREDNISolone (SOLU-MEDROL) injection  40 mg Intravenous Q12H  . metoprolol tartrate  50 mg Oral BID  . mirtazapine  15 mg Oral QHS  . multivitamin with minerals  1 tablet Oral Daily  . omega-3 acid ethyl esters  1 g Oral Daily  . psyllium  1 packet Oral Q2200  . sodium chloride flush  3 mL Intravenous Q12H    Vital Signs     Vitals:   11/10/20 2303 11/11/20 0427 11/11/20 0712 11/11/20 0743  BP:  138/81  (!) 159/84  Pulse:  (!) 47  62  Resp:  18  18  Temp:  97.9 F (36.6 C)  98.3 F (36.8 C)  TempSrc:  Oral  Oral  SpO2: 94% 91% 92% 98%  Weight:    91.5 kg  Height:        Intake/Output Summary (Last 24 hours) at 11/11/2020 1033 Last data filed at 11/11/2020 0930 Gross per 24 hour  Intake 363 ml  Output 325 ml  Net 38 ml   Filed Weights   11/10/20 1115 11/11/20 0743  Weight: 94.9 kg 91.5 kg    Physical Exam    GEN: Well nourished, well developed, in no acute distress.  HEENT: normal.  Neck: Supple, no JVD, carotid bruits, or masses. Cardiac: irr, irr, 1-2+ edema  Respiratory:  Respirations regular and unlabored, clear to auscultation bilaterally. GI: Soft, nontender, nondistended, BS + x 4. MS: no deformity or atrophy. Skin: warm and dry, no rash. Neuro:  Strength and sensation are intact. Psych: Normal affect.  Labs    CBC Recent Labs    11/10/20 1120 11/11/20 0516  WBC 8.6 6.0  NEUTROABS 5.7  --   HGB 13.6 13.4  HCT 41.6 40.3  MCV 95.2 94.6  PLT 157 156  Basic Metabolic Panel Recent Labs    84/66/59 1120 11/11/20 0516  NA 130* 128*  K 4.3 4.1  CL 95* 92*  CO2 25 25  GLUCOSE 128* 144*  BUN 16 17  CREATININE 1.22 1.34*  CALCIUM 9.2 8.8*  MG  --  1.9   Liver Function Tests No results for input(s): AST, ALT, ALKPHOS, BILITOT, PROT, ALBUMIN in the last 72 hours. No results for input(s): LIPASE, AMYLASE in the last 72 hours. Cardiac Enzymes No results for input(s): CKTOTAL, CKMB, CKMBINDEX, TROPONINI in the last 72 hours. BNP Recent Labs    11/10/20 1120  BNP 599.6*   D-Dimer No results for input(s): DDIMER in the last 72 hours. Hemoglobin A1C No results for input(s): HGBA1C in the last 72 hours. Fasting Lipid Panel No results for input(s): CHOL, HDL, LDLCALC, TRIG, CHOLHDL, LDLDIRECT in the last 72 hours. Thyroid Function Tests No results for input(s): TSH,  T4TOTAL, T3FREE, THYROIDAB in the last 72 hours.  Invalid input(s): FREET3  Telemetry    afib with controlled vr  ECG    afib with controlled vr. No ischemia  Radiology    DG Chest 2 View  Result Date: 11/10/2020 CLINICAL DATA:  Worsening shortness of breath over past few days EXAM: CHEST - 2 VIEW COMPARISON:  04/30/2018 FINDINGS: Enlargement of cardiac silhouette with slight pulmonary vascular congestion. Mediastinal contours normal. Atherosclerotic calcification aorta. Scattered interstitial infiltrates throughout both lungs new since prior exam which may represent pulmonary edema or atypical infection. Subsegmental atelectasis at lingula. Probable small RIGHT pleural effusion. No pneumothorax or acute osseous findings. IMPRESSION: Enlargement of cardiac silhouette with pulmonary vascular congestion and new interstitial infiltrates, favor pulmonary edema over atypical infection. Electronically Signed   By: Ulyses Southward M.D.   On: 11/10/2020 12:02    Assessment & Plan    87 yo with history of paroxysmal afib and hypertension admitted with peripheral edema and sob. Mild pulmonary edema on cxr.   1. CHF-had preserved lv funciton in the past. Will review echo durring this admission when available. Continue careful diuresis following renal function and electrolytes.  2arrhythmia-ekg shows probalbe sinus arrythmia but has had paroxysmall afib in he past and has been controlled with metoprolol and has deferred chronic anticoagulation. Will follow.   Signed, Darlin Priestly Tiauna Whisnant MD 11/11/2020, 10:33 AM  Pager: [phone]

## 2020-11-12 LAB — BASIC METABOLIC PANEL
Anion gap: 12 (ref 5–15)
BUN: 24 mg/dL — ABNORMAL HIGH (ref 8–23)
CO2: 29 mmol/L (ref 22–32)
Calcium: 9 mg/dL (ref 8.9–10.3)
Chloride: 88 mmol/L — ABNORMAL LOW (ref 98–111)
Creatinine, Ser: 1.14 mg/dL (ref 0.61–1.24)
GFR, Estimated: >60 mL/min (ref >60)
Glucose, Bld: 110 mg/dL — ABNORMAL HIGH (ref 70–99)
Potassium: 3.8 mmol/L (ref 3.5–5.1)
Sodium: 129 mmol/L — ABNORMAL LOW (ref 135–145)

## 2020-11-12 MED ORDER — FUROSEMIDE 10 MG/ML IJ SOLN: 20.0000 mg | Freq: Every day | INTRAMUSCULAR | Status: DC

## 2020-11-12 MED ORDER — IPRATROPIUM-ALBUTEROL 0.5-2.5 (3) MG/3ML IN SOLN
3.0000 mL | Freq: Three times a day (TID) | RESPIRATORY_TRACT | Status: DC
  Administered 2020-11-13: 3 mL via RESPIRATORY_TRACT
  Filled 2020-11-12: qty 3

## 2020-11-12 NOTE — Progress Notes (Signed)
[facility]  - [place] at [facility]   PATIENT NAME: Brian Lara    MR#:  [id number]  DATE OF BIRTH:  12/27/1925  SUBJECTIVE:  patient came in from his independent living facility with increasing shortness of breath. Found to be in congestive heart failure.  Patient responding to treatment well. Breathing much improved. He is unable to sleep well in the hospital due to discomfort in the bed. Denies any chest pain.  Does not wear chronic oxygen at home  REVIEW OF SYSTEMS:   Review of Systems  Constitutional: Negative for chills, fever and weight loss.  HENT: Negative for ear discharge, ear pain and nosebleeds.   Eyes: Negative for blurred vision, pain and discharge.  Respiratory: Positive for shortness of breath. Negative for sputum production, wheezing and stridor.   Cardiovascular: Negative for chest pain, palpitations, orthopnea and PND.  Gastrointestinal: Negative for abdominal pain, diarrhea, nausea and vomiting.  Genitourinary: Negative for frequency and urgency.  Musculoskeletal: Negative for back pain and joint pain.  Neurological: Positive for weakness. Negative for sensory change, speech change and focal weakness.  Psychiatric/Behavioral: Negative for depression and hallucinations. The patient is not nervous/anxious.    Tolerating Diet:yes Tolerating PT:   DRUG ALLERGIES:   Allergies  Allergen Reactions  . Codeine Other (See Comments)  . Morphine And Related Other (See Comments)    VITALS:  Blood pressure (!) 119/56, pulse 60, temperature 98 F (36.7 C), temperature source Oral, resp. rate 15, height 5\' 9"  (1.753 m), weight 91.5 kg, SpO2 92 %.  PHYSICAL EXAMINATION:   Physical Exam  GENERAL:  87 y.o.-year-old patient lying in the bed with no acute distress.  LUNGS: Normal breath sounds bilaterally, no wheezing, rales, rhonchi. No use of accessory muscles of respiration.  CARDIOVASCULAR: S1, S2 normal. No murmurs, rubs, or gallops.  ABDOMEN:  Soft, nontender, nondistended. Bowel sounds present. No organomegaly or mass.  EXTREMITIES: No cyanosis, clubbing  +edema b/l.    NEUROLOGIC: Cranial nerves II through XII are intact. No focal Motor or sensory deficits b/l.   PSYCHIATRIC:  patient is alert and oriented x 3.  SKIN: No obvious rash, lesion, or ulcer.   LABORATORY PANEL:  CBC Recent Labs  Lab 11/11/20 0516  WBC 6.0  HGB 13.4  HCT 40.3  PLT 156    Chemistries  Recent Labs  Lab 11/11/20 0516 11/12/20 0447  NA 128* 129*  K 4.1 3.8  CL 92* 88*  CO2 25 29  GLUCOSE 144* 110*  BUN 17 24*  CREATININE 1.34* 1.14  CALCIUM 8.8* 9.0  MG 1.9  --    Cardiac Enzymes No results for input(s): TROPONINI in the last 168 hours. RADIOLOGY:  No results found. ASSESSMENT AND PLAN:  Brian Lara is a 87 y.o. male with medical history significant of HTN, HLD, COPD, A fib not on AC, BPH, CKD-IIIa, depression, who presents with SOB. Patient states that he has been having shortness of breath for more than 3 days, which has been progressively worsening, particularly exertion.  Patient has cough with clear mucus production  Acute respiratory failure with hypoxia due to combination of acute CHF and COPD exacerbation: Patient has leg edema, elevated BNP, pulmonary edema on chest x-ray, clinically consistent with CHF.  Patient has mild wheezing on auscultation, indicating COPD exacerbation. -Bronchodilators -IV diuretics--change to 20 mg bid- to IV daily dose --UOP total-- ~3L -Nasal cannula oxygen to maintain oxygen saturation above 93%--wean as tolerated  Acute CHF (congestive heart failure): suspected diastolic --  echo on 01/29/2016 showed EF> 55%, indicating possible acute diastolic CHF.  Consulted Dr. Lady Gary of cardiology -Lasix 20 mg bid by IV--change to daily -Daily weights -strict I/O's -Low salt diet -Fluid restriction -- amlodipine and metoprolol  COPD exacerbation (HCC) -Bronchodilators -As needed  Mucinex -Solu-Medrol 40 mg twice daily--d/c it.pt not wheezing. Symptoms more due to CHF -Incentive spirometry  Hypertension: -IV hydralazine as needed -Continue home metoprolol, amlodipine -Hold Dyazide since patient is on IV Lasix  Hypercholesteremia -Lipitor  Atrial fibrillation [facility]): Patient is not taking at that coagulants currently -Continue metoprolol  Depression -Continue home Celexa   CKD (chronic kidney disease), stage IIIa: --creat up to 1.34--decreased dose of lasix   DVT ppx:  SQ Lovenox Code Status: DNR per pt and his granddaughter Family Communication:  Yes, patient's Granddaughter and son  at bed side 4/9 Disposition Plan:  Anticipate discharge back to previous environment Consults called:  Dr. Lady Gary of card Admission status and Level of care: Progressive Cardiac:    as inpt       Status is: Inpatient  Remains inpatient appropriate because:Inpatient level of care appropriate due to severity of illness   Dispo: The patient is from: Home  Anticipated d/c is to: Home  Patient currently is not medically stable to d/c.              Difficult to place patient No  will continue treatment with IV Lasix today. If remains stable plan to discharge tomorrow back to independent living.       TOTAL TIME TAKING CARE OF THIS PATIENT: *25* minutes.  >50% time spent on counselling and coordination of care  Note: This dictation was prepared with Dragon dictation along with smaller phrase technology. Any transcriptional errors that result from this process are unintentional.  Enedina Finner M.D    [facility]   CC: Primary care physician; Brian Lara, MDPatient ID: Brian Lara, male   DOB: [birth year], 87 y.o.   MRN: [id number]

## 2020-11-12 NOTE — Progress Notes (Signed)
Patient Name: Brian Lara Date of Encounter: 11/12/2020  Hospital Problem List     Active Problems:   Acute CHF (congestive heart failure) (HCC)   COPD exacerbation (HCC)   Hypertension   Hypercholesteremia   Atrial fibrillation (HCC)   Acute respiratory failure with hypoxia (HCC)   Depression   CKD (chronic kidney disease), stage IIIa    Patient Profile      87 y.o.malewith history ofatrial fibrillation, hypertension, reactive airway disease. He presented to the er with complaints of sob.Had bilateral lower extremity edema. Pulse ox was 88 on room air. Troponin was negative. BNP mildly elevated to 599. CXR mild pulmonary edema. Echo in 2017 normal. He has noted several days of increaseing shortness of breath and lower extremity edema. Worsened on day of admission prompting his presentation. He feels better since being admitted and given diuretics. EKG shows probable sinus arrhythmia although p waves are somewhat difficult to assess in some leads. Has had afib in the past but has deferred anticoagulation. Has remained on asa and metoprolol.  Subjective   Feels a little better today although still somewhat weak and unsteady when standing up.  Still requiring oxygen per nasal cannula which has not been the case as an outpatient previously.  Physical therapy.  Will work with him today.  He has a condom catheter in place.  Inpatient Medications    . amLODipine  5 mg Oral Daily  . aspirin EC  325 mg Oral Daily  . atorvastatin  10 mg Oral QHS  . cholecalciferol  1,000 Units Oral Daily  . citalopram  10 mg Oral Daily  . enoxaparin (LOVENOX) injection  0.5 mg/kg Subcutaneous Q24H  . fluticasone  1 spray Each Nare Daily  . furosemide  20 mg Intravenous BID  . gabapentin  100 mg Oral BID  . ipratropium-albuterol  3 mL Nebulization Q6H  . loratadine  10 mg Oral Daily  . metoprolol tartrate  50 mg Oral BID  . mirtazapine  15 mg Oral QHS  . multivitamin with minerals  1 tablet  Oral Daily  . omega-3 acid ethyl esters  1 g Oral Daily  . polyethylene glycol  17 g Oral Daily  . psyllium  1 packet Oral Q2200  . sodium chloride flush  3 mL Intravenous Q12H    Vital Signs    Vitals:   11/11/20 2032 11/12/20 0421 11/12/20 0734 11/12/20 0738  BP:  140/70 (!) 147/78   Pulse:  65 64   Resp:  (!) 21 18   Temp:  98.1 F (36.7 C) 98.3 F (36.8 C)   TempSrc:  Oral Oral   SpO2: 93% 91% 95% 94%  Weight:      Height:        Intake/Output Summary (Last 24 hours) at 11/12/2020 1037 Last data filed at 11/12/2020 0725 Gross per 24 hour  Intake 1080 ml  Output 1775 ml  Net -695 ml   Filed Weights   11/10/20 1115 11/11/20 0743  Weight: 94.9 kg 91.5 kg    Physical Exam    GEN: Well nourished, well developed, in no acute distress.  HEENT: normal.  Neck: Supple, no JVD, carotid bruits, or masses. Cardiac: RRR, no murmurs, rubs, or gallops. No clubbing, cyanosis, edema.  Radials/DP/PT 2+ and equal bilaterally.  Respiratory:  Respirations regular and unlabored, clear to auscultation bilaterally. GI: Soft, nontender, nondistended, BS + x 4. MS: no deformity or atrophy. Skin: warm and dry, no rash. Neuro:  Strength and  sensation are intact. Psych: Normal affect.  Labs    CBC Recent Labs    11/10/20 1120 11/11/20 0516  WBC 8.6 6.0  NEUTROABS 5.7  --   HGB 13.6 13.4  HCT 41.6 40.3  MCV 95.2 94.6  PLT 157 156   Basic Metabolic Panel Recent Labs    32/54/98 0516 11/12/20 0447  NA 128* 129*  K 4.1 3.8  CL 92* 88*  CO2 25 29  GLUCOSE 144* 110*  BUN 17 24*  CREATININE 1.34* 1.14  CALCIUM 8.8* 9.0  MG 1.9  --    Liver Function Tests No results for input(s): AST, ALT, ALKPHOS, BILITOT, PROT, ALBUMIN in the last 72 hours. No results for input(s): LIPASE, AMYLASE in the last 72 hours. Cardiac Enzymes No results for input(s): CKTOTAL, CKMB, CKMBINDEX, TROPONINI in the last 72 hours. BNP Recent Labs    11/10/20 1120  BNP 599.6*   D-Dimer No  results for input(s): DDIMER in the last 72 hours. Hemoglobin A1C No results for input(s): HGBA1C in the last 72 hours. Fasting Lipid Panel No results for input(s): CHOL, HDL, LDLCALC, TRIG, CHOLHDL, LDLDIRECT in the last 72 hours. Thyroid Function Tests No results for input(s): TSH, T4TOTAL, T3FREE, THYROIDAB in the last 72 hours.  Invalid input(s): FREET3  Telemetry    Atrial fibrillation with controlled ventricular response  ECG    Atrial fibrillation with controlled ventricular response  Radiology    DG Chest 2 View  Result Date: 11/10/2020 CLINICAL DATA:  Worsening shortness of breath over past few days EXAM: CHEST - 2 VIEW COMPARISON:  04/30/2018 FINDINGS: Enlargement of cardiac silhouette with slight pulmonary vascular congestion. Mediastinal contours normal. Atherosclerotic calcification aorta. Scattered interstitial infiltrates throughout both lungs new since prior exam which may represent pulmonary edema or atypical infection. Subsegmental atelectasis at lingula. Probable small RIGHT pleural effusion. No pneumothorax or acute osseous findings. IMPRESSION: Enlargement of cardiac silhouette with pulmonary vascular congestion and new interstitial infiltrates, favor pulmonary edema over atypical infection. Electronically Signed   By: Ulyses Southward M.D.   On: 11/10/2020 12:02    Assessment & Plan     87 yo with history of paroxysmal afib and hypertension admitted with peripheral edema and sob. Mild pulmonary edema on cxr.    1. CHF-had preserved lv funciton in the past.  Echo still pending.  Will review when available. Continue careful diuresis.  His creatinine.  Is improved today down to 1.14 from 1.34 has diuresed 1775 cc since admission.  Breathing somewhat better.  No lower extremity edema.  Still requiring oxygen.  We will continue with 20 mg of furosemide IV.  Follow-up knee function.  2.  History of A. fib-ekg shows possible sinus arrythmia but has had paroxysmal afib in he  past and has been controlled with metoprolol.  Rate appears controlled.  Occasionally on monitor appears to have A. fib however rate is well controlled.  Has deferred anticoagulation in the past and will continue to remain off of this for now due to bleeding risk.  3.  Deconditioning-agree with physical therapy.  Will need to determine disposition prior to discharge.  Also need determine whether oxygen can be necessary at discharge.Melven Sartorius Elgie Maziarz MD 11/12/2020, 10:37 AM  Pager: [phone]

## 2020-11-12 NOTE — Evaluation (Signed)
Physical Therapy Evaluation Patient Details Name: Brian Lara MRN: [id number] DOB: 04/30/1926 Today's Date: 11/12/2020   History of Present Illness  Pt presents to [facility] on 11/10/20 with c/o worsening SOB for the past 3 days, diagnosed with acute CHF. PMH: Afib, COPD, hypercholesteremia and HTN.  Clinical Impression  Pt received by PT supine in bed with HOB elevated and son present. Pt agreeable to physical therapy evaluation. Pt on 2L of O2 upon PT arrival with SpO2 reading: 99%.  Pt able to complete supine<>sit transfer using railings and increased time with SBA. Seated EOB on RA SpO2: 92%. Pt reported increased dizziness with supine<>sit transfer, BP: 148/75. Dizziness subsided after 1 minute seated EOB with PLB. Completed sit<>stand transfer with bed set in the lowest position with minA and VCs for hand placement and sequencing provided to the pt. On RA pt ambulated using a RW for 43ft then returned to the bed for a seated rest break SpO2 dropped to 82%, but rebounded to 91% following 30 seconds of PLB seated EOB. Pt did have one instance of increased trunk excursion during ambulation requiring modA from therapist for safety when completing turn, but no LOB. Pt completed a second round of ambulation for 68ft using RW with SpO2 staying >89% throughout ambulation on RA. Educated pt on the importance of pursed lip breathing, pt with appropriate teach back and demonstration of PLB. Completed therapeutic exercises seated EOB: seated marching and LAQs BIL x20 each. Educated pt on the importance of moving his legs and walking with assistance throughout the day to mitigate muscle atrophy that can occur with decreased activity 2/2 to hospital stay. Pt and son verbalized understanding. PT would benefit from skilled physical therapy interventions to continue progress LE strength and cardiovascular endurance to PLOF.      Follow Up Recommendations Home health PT    Equipment Recommendations        Recommendations for Other Services       Precautions / Restrictions Precautions Precautions: Fall Restrictions Weight Bearing Restrictions: No      Mobility  Bed Mobility Overal bed mobility: Modified Independent             General bed mobility comments: Pt requires increased time to complete bed mobility, using hand rails and elevated HOB Patient Response: Cooperative  Transfers Overall transfer level: Needs assistance Equipment used: Standard walker Transfers: Sit to/from Stand Sit to Stand: Min assist         General transfer comment: Pt required minA to complete sit<>stand trasnfer from EOB with the bed in it's lowest position with VCs for hand placement and sequencing provided to the pt  Ambulation/Gait Ambulation/Gait assistance: Mod assist Gait Distance (Feet): 40 Feet Assistive device: Standard walker Gait Pattern/deviations: Shuffle     General Gait Details: Pt with decreased gait velocity and increased trunk excursion when completing turns during gait. He required modA to prevent pt from LOB 1x when turning, no LOB. Pt completed 2 rounds of 70ft ambulation on RA. 1st round of ambulation: pt's SpO2 dropped to 81%, but rebounded to 92% with PLB while seated EOB. 2nd round pt's SpO2 dropped to 89%, but rebounded to 96% while standing and completing pursed lip breathing on RA.  Stairs            Wheelchair Mobility    Modified Rankin (Stroke Patients Only)       Balance Overall balance assessment: Needs assistance Sitting-balance support: Feet supported Sitting balance-Leahy Scale: Good Sitting balance - Comments: Pt able  to shift while seated EOB safely without LOB   Standing balance support: Bilateral upper extremity supported Standing balance-Leahy Scale: Fair Standing balance comment: Pt with decreased stability in the standing position 2/2 to BIL LE weakness, but was able to maintain safety while standing using RW with no LOB                              Pertinent Vitals/Pain Pain Assessment: No/denies pain    Home Living Family/patient expects to be discharged to:: Private residence Living Arrangements: Alone Available Help at Discharge: Family Type of Home: House Home Access: Level entry     Home Layout: One level Home Equipment: Cane - single point;Walker - standard;Shower seat;Grab bars - tub/shower Additional Comments: Pt lives in an independent living facility - pt's family able to assist with needs intermittently    Prior Function Level of Independence: Independent with assistive device(s)         Comments: Pt used a SPC for all ambulation within his home, completed all ADLs mod I and he was able to shower using the shower seat.     Hand Dominance        Extremity/Trunk Assessment        Lower Extremity Assessment Lower Extremity Assessment: Generalized weakness (Pt with generalized BIL LE weakness)    Cervical / Trunk Assessment Cervical / Trunk Assessment: Kyphotic  Communication   Communication: No difficulties  Cognition Arousal/Alertness: Awake/alert Behavior During Therapy: WFL for tasks assessed/performed Overall Cognitive Status: Within Functional Limits for tasks assessed                                 General Comments: Pt alert and oriented x4      General Comments      Exercises Total Joint Exercises Long Arc Quad: AROM;Both;15 reps (VCs provided to patient for PLB throughout)   Assessment/Plan    PT Assessment Patient needs continued PT services  PT Problem List Decreased strength;Decreased mobility;Decreased safety awareness;Decreased activity tolerance;Decreased balance;Cardiopulmonary status limiting activity       PT Treatment Interventions DME instruction;Therapeutic exercise;Gait training;Balance training;Functional mobility training;Therapeutic activities;Patient/family education    PT Goals (Current goals can be found in the Care  Plan section)  Acute Rehab PT Goals Patient Stated Goal: Return home safely PT Goal Formulation: With patient Time For Goal Achievement: 11/26/20 Potential to Achieve Goals: Fair    Frequency Min 2X/week   Barriers to discharge        Co-evaluation               AM-PAC PT "6 Clicks" Mobility  Outcome Measure Help needed turning from your back to your side while in a flat bed without using bedrails?: None Help needed moving from lying on your back to sitting on the side of a flat bed without using bedrails?: None Help needed moving to and from a bed to a chair (including a wheelchair)?: A Little Help needed standing up from a chair using your arms (e.g., wheelchair or bedside chair)?: A Little Help needed to walk in hospital room?: A Little Help needed climbing 3-5 steps with a railing? : A Lot 6 Click Score: 19    End of Session Equipment Utilized During Treatment: Gait belt Activity Tolerance: Patient tolerated treatment well Patient left: in bed;with call bell/phone within reach;with family/visitor present (Son seated EOB) Nurse Communication: Mobility status (  SpO2 findings discussed with MDs) PT Visit Diagnosis: Unsteadiness on feet (R26.81);Muscle weakness (generalized) (M62.81);Difficulty in walking, not elsewhere classified (R26.2)    Time: 1020-1105 PT Time Calculation (min) (ACUTE ONLY): 45 min   Charges:   PT Evaluation $PT Eval Low Complexity: 1 Low PT Treatments $Gait Training: 8-22 mins $Therapeutic Activity: 8-22 mins        Minette Headland, PT, DPT 11/12/20, 11:56 AM   Devoria Albe 11/12/2020, 11:43 AM

## 2020-11-13 MED ORDER — FUROSEMIDE 20 MG PO TABS: 20.0000 mg | ORAL_TABLET | Freq: Every day | ORAL | 1 refills | Status: DC

## 2020-11-13 MED ORDER — FUROSEMIDE 20 MG PO TABS
20.0000 mg | ORAL_TABLET | Freq: Every day | ORAL | Status: DC
  Administered 2020-11-13: 20 mg via ORAL
  Filled 2020-11-13: qty 1

## 2020-11-13 MED ORDER — FUROSEMIDE 20 MG PO TABS: 20.0000 mg | ORAL_TABLET | Freq: Two times a day (BID) | ORAL | Status: DC

## 2020-11-13 NOTE — Consult Note (Signed)
   Heart Failure Nurse Navigator Note  HF Echo to be completed as outpatient.  He presented with worsening shortness of breath x3 days.  Comorbidities:  COPD Hypertension Hyperlipidemia Atrial fibrillation Depression Chronic kidney disease stage III  Medications:  Amlodipine 5 mg daily Aspirin 325 daily Atorvastatin 10 mg daily Furosemide 20 mg daily Metoprolol tart Tate 50 mg 2 times a day  Labs:  Sodium 129, potassium 3.8, chloride 88, CO2 29, BUN 24, creatinine 1.14, magnesium 1.9 Intake 1200 mL Output 1600 ml Weight is 90 kg yesterday's weight was 91.5  Assessment:  General-he is awake and alert lying in bed, granddaughter and son are at the bedside.  He is in no acute distress states that he is going home today.  HEENT-wears glasses normocephalic.  Cardiac-heart tones of regular rate and rhythm.  Chest-breath sounds are clear to anterior auscultation  Abdomen-rounded soft, nontender  Musculoskeletal -there is no lower extremity edema  noted  Psych-is pleasant and appropriate  Neurologic-speech is clear moves all extremities without difficulty.    Initial meeting with patient today and family.  He stated at this time that he does not have a scale but they will get 1.  Discussed the importance of daily weights and recording.  Patient and daughter both admit that he uses salt at the table.  Explained the reasoning behind removing salt from the table, they voiced understanding.  Also discussed food to avoid and foods that were appropriate for him with heart failure.  And stressed the importance of moving saltshaker from the table.   Discussed the living with heart failure booklet along with went over the zone magnet and what to report to physician.   Tresa Endo RN [facility]  Also discussed the outpatient heart failure clinic and was given information about the clinic.

## 2020-11-13 NOTE — Progress Notes (Signed)
Patient Name: Brian Lara Date of Encounter: 11/13/2020  Hospital Problem List     Active Problems:   Acute CHF (congestive heart failure) (HCC)   COPD exacerbation (HCC)   Hypertension   Hypercholesteremia   Atrial fibrillation (HCC)   Acute respiratory failure with hypoxia (HCC)   Depression   CKD (chronic kidney disease), stage IIIa    Patient Profile      87 y.o.malewith history ofatrial fibrillation, hypertension, reactive airway disease. He presented to the er with complaints of sob.Had bilateral lower extremity edema. Pulse ox was 88 on room air. Troponin was negative. BNP mildly elevated to 599. CXR mild pulmonary edema. Echo in 2017 normal. He has noted several days of increaseing shortness of breath and lower extremity edema. Worsened on day of admission prompting his presentation. He feels better since being admitted and given diuretics. EKG shows probable sinus arrhythmia although p waves are somewhat difficult to assess in some leads. Has had afib in the past but has deferred anticoagulation. Has remained on asa and metoprolol.  Subjective   Looks good this morning.  Was able to ambulate to the hallway from his bed and back with physical therapy assistance.  Will likely need oxygen therapy and outpatient physical therapy at least temporarily after discharge.  Inpatient Medications    . amLODipine  5 mg Oral Daily  . aspirin EC  325 mg Oral Daily  . atorvastatin  10 mg Oral QHS  . cholecalciferol  1,000 Units Oral Daily  . citalopram  10 mg Oral Daily  . enoxaparin (LOVENOX) injection  0.5 mg/kg Subcutaneous Q24H  . fluticasone  1 spray Each Nare Daily  . furosemide  20 mg Oral Daily  . gabapentin  100 mg Oral BID  . ipratropium-albuterol  3 mL Nebulization TID  . loratadine  10 mg Oral Daily  . metoprolol tartrate  50 mg Oral BID  . mirtazapine  15 mg Oral QHS  . multivitamin with minerals  1 tablet Oral Daily  . omega-3 acid ethyl esters  1 g Oral  Daily  . polyethylene glycol  17 g Oral Daily  . psyllium  1 packet Oral Q2200  . sodium chloride flush  3 mL Intravenous Q12H    Vital Signs    Vitals:   11/12/20 1957 11/13/20 0514 11/13/20 0805 11/13/20 0816  BP:  (!) 144/81 (!) 163/79   Pulse:  64 74 71  Resp:  18  18  Temp:  98.6 F (37 C) 98.5 F (36.9 C)   TempSrc:  Oral Oral   SpO2: 95% (!) 89% 92% 92%  Weight:      Height:        Intake/Output Summary (Last 24 hours) at 11/13/2020 0839 Last data filed at 11/13/2020 0041 Gross per 24 hour  Intake 720 ml  Output 1650 ml  Net -930 ml   Filed Weights   11/10/20 1115 11/11/20 0743 11/12/20 1846  Weight: 94.9 kg 91.5 kg 90 kg    Physical Exam    GEN: Well nourished, well developed, in no acute distress.  HEENT: normal.  Neck: Supple, no JVD, carotid bruits, or masses. Cardiac: Irregularly irregular Respiratory:  Respirations regular and unlabored, clear to auscultation bilaterally. GI: Soft, nontender, nondistended, BS + x 4. MS: no deformity or atrophy. Skin: warm and dry, no rash. Neuro:  Strength and sensation are intact. Psych: Normal affect.  Labs    CBC Recent Labs    11/10/20 1120 11/11/20 0516  WBC 8.6 6.0  NEUTROABS 5.7  --   HGB 13.6 13.4  HCT 41.6 40.3  MCV 95.2 94.6  PLT 157 156   Basic Metabolic Panel Recent Labs    31/51/76 0516 11/12/20 0447  NA 128* 129*  K 4.1 3.8  CL 92* 88*  CO2 25 29  GLUCOSE 144* 110*  BUN 17 24*  CREATININE 1.34* 1.14  CALCIUM 8.8* 9.0  MG 1.9  --    Liver Function Tests No results for input(s): AST, ALT, ALKPHOS, BILITOT, PROT, ALBUMIN in the last 72 hours. No results for input(s): LIPASE, AMYLASE in the last 72 hours. Cardiac Enzymes No results for input(s): CKTOTAL, CKMB, CKMBINDEX, TROPONINI in the last 72 hours. BNP Recent Labs    11/10/20 1120  BNP 599.6*   D-Dimer No results for input(s): DDIMER in the last 72 hours. Hemoglobin A1C No results for input(s): HGBA1C in the last 72  hours. Fasting Lipid Panel No results for input(s): CHOL, HDL, LDLCALC, TRIG, CHOLHDL, LDLDIRECT in the last 72 hours. Thyroid Function Tests No results for input(s): TSH, T4TOTAL, T3FREE, THYROIDAB in the last 72 hours.  Invalid input(s): FREET3  Telemetry    Atrial fibrillation with controlled ventricular response  ECG       Radiology    DG Chest 2 View  Result Date: 11/10/2020 CLINICAL DATA:  Worsening shortness of breath over past few days EXAM: CHEST - 2 VIEW COMPARISON:  04/30/2018 FINDINGS: Enlargement of cardiac silhouette with slight pulmonary vascular congestion. Mediastinal contours normal. Atherosclerotic calcification aorta. Scattered interstitial infiltrates throughout both lungs new since prior exam which may represent pulmonary edema or atypical infection. Subsegmental atelectasis at lingula. Probable small RIGHT pleural effusion. No pneumothorax or acute osseous findings. IMPRESSION: Enlargement of cardiac silhouette with pulmonary vascular congestion and new interstitial infiltrates, favor pulmonary edema over atypical infection. Electronically Signed   By: Ulyses Southward M.D.   On: 11/10/2020 12:02    Assessment & Plan     87 yo with history of paroxysmal afib and hypertension admitted with peripheral edema and sob. Mild pulmonary edema on cxr.    1. CHF-had preserved lv funciton in the past.  Echo still pending.  Will review when available. Continue careful diuresis.  His creatinine.  Is improved today down to 1.14 from 1.34 has diuresed 1650 cc since admission.  Breathing somewhat better.  No lower extremity edema.  Still requiring oxygen and most likely will need this at least temporarily at discharge.  We will continue with 20 mg of furosemide p.o. which should remain after discharge.  Renal function stable..   2.  History of A. fib-ekg shows possible sinus arrythmia but has had paroxysmal afib in he past and has been controlled with metoprolol.  Rate appears  controlled.  Occasionally on monitor appears to have A. fib however rate is well controlled.  Has deferred anticoagulation in the past and will continue to remain off of this for now due the aforementioned and do to bleeding risk.  3.  Deconditioning-agree with physical therapy.    Appears okay for discharge.  Will need outpatient physical therapy at least temporarily.   Signed, Darlin Priestly Itati Brocksmith MD 11/13/2020, 8:39 AM  Pager: [phone]

## 2020-11-13 NOTE — Care Management Important Message (Signed)
Important Message  Patient Details  Name: Brian Lara MRN: [id number] Date of Birth: 1926/07/07   Medicare Important Message Given:  Yes     [name] 11/13/2020, 11:04 AM

## 2020-11-13 NOTE — Progress Notes (Signed)
SATURATION QUALIFICATIONS: (This note is used to comply with regulatory documentation for home oxygen)  Patient Saturations on Room Air at Rest = 92%  Patient Saturations on Room Air while Ambulating = 87%  Patient Saturations on 2 Liters of oxygen while Ambulating = 95%  Please briefly explain why patient needs home oxygen: Pt desats on ambulation.

## 2020-11-13 NOTE — Discharge Instructions (Signed)
2 gram sodium diet

## 2020-11-13 NOTE — Discharge Summary (Signed)
[facility] [place] [facility]   PATIENT NAME: Brian Lara    MR#:  [id number]  DATE OF BIRTH:  28-Apr-1926  DATE OF ADMISSION:  11/10/2020 ADMITTING PHYSICIAN: Lorretta Harp, MD  DATE OF DISCHARGE: 11/13/2020  PRIMARY CARE PHYSICIAN: Brian Sell, MD    ADMISSION DIAGNOSIS:  Acute CHF (congestive heart failure) (HCC) [I50.9] Acute respiratory failure with hypoxia (HCC) [J96.01]  DISCHARGE DIAGNOSIS:  acute on chronic congestive heart failure diastolic acute hypoxic respiratory failure secondary to CHF now on oxygen chronic atrial fibrillation  SECONDARY DIAGNOSIS:   Past Medical History:  Diagnosis Date  . Atrial fibrillation (HCC)   . COPD (chronic obstructive pulmonary disease) (HCC)   . Hypercholesteremia   . Hypertension     HOSPITAL COURSE:   Brian Lara a 86 y.o.malewith medical history significant ofHTN, HLD, COPD, A fib not on AC, BPH, CKD-IIIa, depression, who presents with SOB. Patient states that he has been having shortness of breath for more than 3 days, which has been progressively worsening, particularly exertion.Patient has cough with clear mucus production  Acute respiratory failure with hypoxiadue tocombination of acute CHF and COPD exacerbation:Patient has leg edema, elevated BNP, pulmonary edema on chest x-ray, clinically consistent with CHF. Patient has mildwheezing on auscultation, indicating COPD exacerbation. -Bronchodilators -IV diuretics--change to 20 mg bid- to IV daily dose--change to po lasix 20 mg qd --UOP total-- ~4L -Nasal cannula oxygen to maintain oxygen saturation above 93%--wean as tolerated --pt qualifies for home oxygen--2L/min, [place]  Acute CHF (congestive heart failure):diastolic -- echo on 01/29/2016 showed EF>55%,indicating possible acute diastolic CHF. Consulted Brian Lara of cardiology -Lasix 20 mg bid by IV--change to daily -Daily weights -strict I/O's -Low salt diet -Fluid  restriction -- amlodipine and metoprolol  COPD exacerbation (HCC) -Bronchodilators -As needed Mucinex -Solu-Medrol 40 mg twice daily--d/c it.pt not wheezing. Symptoms more due to CHF -Incentive spirometry  Hypertension: -IV hydralazine as needed -Continue home metoprolol, amlodipine -Hold Dyazide since patient is on Lasix  Hypercholesteremia -Lipitor  Atrial fibrillation (HCC):Patient is not taking at that coagulants currently -Continue metoprolol  Depression -Continue home Celexa   CKD (chronic kidney disease), stage IIIa:  --creat down to 1.1 (1.34) at discharge  patient overall improving. Will switch to oral Lasix. Discharge back to independent living with oxygen. Patient will follow-up with Brian Lara in a week to 10 days as outpatient. DVT ppx: SQ Lovenox Code Status:DNR per pt and hisgranddaughter Family Communication: Yes, patient's Granddaughter at bed side 4/9 Disposition Plan: Anticipate discharge back to previous environment Consults called:Brian Lara of card Admission status and Level of care:Progressive Cardiac: as inpt    Status is: Inpatient  Remains inpatient appropriate because:Inpatient level of care appropriate due to severity of illness   Dispo: The patient is from:Home Anticipated d/c is TD:[facility] (independent apt ) with HH and oxygen Patient currently is  medically stable to d/c. Difficult to place patient No  CONSULTS OBTAINED:  Treatment Team:  Brian Heading, MD  DRUG ALLERGIES:   Allergies  Allergen Reactions  . Codeine Other (See Comments)  . Morphine And Related Other (See Comments)    DISCHARGE MEDICATIONS:   Allergies as of 11/13/2020      Reactions   Codeine Other (See Comments)   Morphine And Related Other (See Comments)      Medication List    STOP taking these medications   triamterene-hydrochlorothiazide 37.5-25 MG capsule Commonly known as:  DYAZIDE     TAKE these medications  amLODipine 5 MG tablet Commonly known as: NORVASC Take 5 mg by mouth daily.   aspirin 325 MG tablet Take 325 mg by mouth daily.   atorvastatin 10 MG tablet Commonly known as: LIPITOR Take 10 mg by mouth daily.   Butalbital-APAP-Caffeine 50-325-40 MG capsule Take 1 capsule by mouth daily as needed for headache.   Cholecalciferol 25 MCG (1000 UT) capsule Take 1,000 Units by mouth daily.   citalopram 10 MG tablet Commonly known as: CELEXA Take 10 mg by mouth daily.   fexofenadine 180 MG tablet Commonly known as: ALLEGRA Take 60 mg by mouth daily.   finasteride 5 MG tablet Commonly known as: PROSCAR Take 5 mg by mouth daily.   Fish Oil 1000 MG Cpdr Take 1 capsule by mouth in the morning and at bedtime.   fluticasone 50 MCG/ACT nasal spray Commonly known as: FLONASE Place 1 spray into both nostrils daily.   furosemide 20 MG tablet Commonly known as: LASIX Take 1 tablet (20 mg total) by mouth daily. Start taking on: November 14, 2020   gabapentin 100 MG capsule Commonly known as: NEURONTIN Take 100 mg by mouth 2 (two) times daily.   Glucosamine Sulfate 1000 MG Caps Take 1 tablet by mouth 2 (two) times daily.   metoprolol tartrate 50 MG tablet Commonly known as: LOPRESSOR Take 50 mg by mouth 2 (two) times daily.   mirtazapine 15 MG tablet Commonly known as: REMERON Take 15 mg by mouth at bedtime.   Multi-Vitamin tablet Take 1 tablet by mouth daily.   psyllium 58.6 % packet Commonly known as: METAMUCIL Take 1 packet by mouth daily.            Durable Medical Equipment  (From admission, onward)         Start     Ordered   11/13/20 0810  For home use only DME oxygen  Once       Question Answer Comment  Length of Need Lifetime   Mode or (Route) Nasal cannula   Liters per Minute 2   Frequency Continuous (stationary and portable oxygen unit needed)   Oxygen conserving device Yes   Oxygen delivery system Gas       11/13/20 0809          If you experience worsening of your admission symptoms, develop shortness of breath, life threatening emergency, suicidal or homicidal thoughts you must seek medical attention immediately by calling 911 or calling your MD immediately  if symptoms less severe.  You Must read complete instructions/literature along with all the possible adverse reactions/side effects for all the Medicines you take and that have been prescribed to you. Take any new Medicines after you have completely understood and accept all the possible adverse reactions/side effects.   Please note  You were cared for by a hospitalist during your hospital stay. If you have any questions about your discharge medications or the care you received while you were in the hospital after you are discharged, you can call the unit and asked to speak with the hospitalist on call if the hospitalist that took care of you is not available. Once you are discharged, your primary care physician will handle any further medical issues. Please note that NO REFILLS for any discharge medications will be authorized once you are discharged, as it is imperative that you return to your primary care physician (or establish a relationship with a primary care physician if you do not have one) for your aftercare needs so that  they can reassess your need for medications and monitor your lab values. Today   SUBJECTIVE  feeling ok Slept better last nite  VITAL SIGNS:  Blood pressure (!) 163/79, pulse 71, temperature 98.5 F (36.9 C), temperature source Oral, resp. rate 18, height 5\' 9"  (1.753 m), weight 90 kg, SpO2 92 %.  I/O:    Intake/Output Summary (Last 24 hours) at 11/13/2020 0929 Last data filed at 11/13/2020 0902 Gross per 24 hour  Intake 1080 ml  Output 1900 ml  Net -820 ml    PHYSICAL EXAMINATION:  GENERAL:  87 y.o.-year-old patient lying in the bed with no acute distress.  LUNGS: Normal breath sounds bilaterally,  no wheezing, rales,rhonchi or crepitation. No use of accessory muscles of respiration.  CARDIOVASCULAR: S1, S2 normal. No murmurs, rubs, or gallops.  ABDOMEN: Soft, non-tender, non-distended. Bowel sounds present. No organomegaly or mass.  EXTREMITIES: No pedal edema, cyanosis, or clubbing.  NEUROLOGIC: Cranial nerves II through XII are intact. Muscle strength 5/5 in all extremities. Sensation intact. Gait not checked.  PSYCHIATRIC: The patient is alert and oriented x 3.  SKIN: No obvious rash, lesion, or ulcer.   DATA REVIEW:   CBC  Recent Labs  Lab 11/11/20 0516  WBC 6.0  HGB 13.4  HCT 40.3  PLT 156    Chemistries  Recent Labs  Lab 11/11/20 0516 11/12/20 0447  NA 128* 129*  K 4.1 3.8  CL 92* 88*  CO2 25 29  GLUCOSE 144* 110*  BUN 17 24*  CREATININE 1.34* 1.14  CALCIUM 8.8* 9.0  MG 1.9  --     Microbiology Results   Recent Results (from the past 240 hour(s))  Resp Panel by RT-PCR (Flu A&B, Covid) Nasopharyngeal Swab     Status: None   Collection Time: 11/10/20 12:02 PM   Specimen: Nasopharyngeal Swab; Nasopharyngeal(NP) swabs in vial transport medium  Result Value Ref Range Status   SARS Coronavirus 2 by RT PCR NEGATIVE NEGATIVE Final    Comment: (NOTE) SARS-CoV-2 target nucleic acids are NOT DETECTED.  The SARS-CoV-2 RNA is generally detectable in upper respiratory specimens during the acute phase of infection. The lowest concentration of SARS-CoV-2 viral copies this assay can detect is 138 copies/mL. A negative result does not preclude SARS-Cov-2 infection and should not be used as the sole basis for treatment or other patient management decisions. A negative result may occur with  improper specimen collection/handling, submission of specimen other than nasopharyngeal swab, presence of viral mutation(s) within the areas targeted by this assay, and inadequate number of viral copies(<138 copies/mL). A negative result must be combined with clinical  observations, patient history, and epidemiological information. The expected result is Negative.  Fact Sheet for Patients:  01/10/21  Fact Sheet for Healthcare Providers:  BloggerCourse.com  This test is no t yet approved or cleared by the [place] FDA and  has been authorized for detection and/or diagnosis of SARS-CoV-2 by FDA under an Emergency Use Authorization (EUA). This EUA will remain  in effect (meaning this test can be used) for the duration of the COVID-19 declaration under Section 564(b)(1) of the Act, 21 U.S.C.section 360bbb-3(b)(1), unless the authorization is terminated  or revoked sooner.       Influenza A by PCR NEGATIVE NEGATIVE Final   Influenza B by PCR NEGATIVE NEGATIVE Final    Comment: (NOTE) The Xpert Xpress SARS-CoV-2/FLU/RSV plus assay is intended as an aid in the diagnosis of influenza from Nasopharyngeal swab specimens and should not be used as  a sole basis for treatment. Nasal washings and aspirates are unacceptable for Xpert Xpress SARS-CoV-2/FLU/RSV testing.  Fact Sheet for Patients: BloggerCourse.com  Fact Sheet for Healthcare Providers: SeriousBroker.it  This test is not yet approved or cleared by the [place] FDA and has been authorized for detection and/or diagnosis of SARS-CoV-2 by FDA under an Emergency Use Authorization (EUA). This EUA will remain in effect (meaning this test can be used) for the duration of the COVID-19 declaration under Section 564(b)(1) of the Act, 21 U.S.C. section 360bbb-3(b)(1), unless the authorization is terminated or revoked.  Performed at [facility], [address], [place], [place] [address]     RADIOLOGY:  No results found.   CODE STATUS:     Code Status Orders  (From admission, onward)         Start     Ordered   11/10/20 1603  Do not attempt resuscitation (DNR)   Continuous        11/10/20 1602        Code Status History    Date Active Date Inactive Code Status Order ID Comments User Context   11/10/2020 1505 11/10/2020 1602 Full Code [id number]  Lorretta Harp, MD ED   Advance Care Planning Activity    Advance Directive Documentation   Flowsheet Row Most Recent Value  Type of Advance Directive Healthcare Power of Attorney  Pre-existing out of facility DNR order (yellow form or pink MOST form) --  "MOST" Form in Place? --       TOTAL TIME TAKING CARE OF THIS PATIENT: *35* minutes.    Enedina Finner M.D  [facility]    CC: Primary care physician; Brian Sell, MD

## 2020-11-14 ENCOUNTER — Telehealth: Payer: Self-pay

## 2020-11-14 NOTE — Telephone Encounter (Signed)
   Post hospital follow-up phone call 24 hours after discharge.   Spoke with Brian Lara, he states that he is breathing okay but just feels weak today.  He has been checking his O2 saturations and is running 91 and 92% on room air.  He states that he did not weigh himself today as his son just brought him a scale.  Instructed to start tomorrow morning before he eats or drinks anything.  Attempted to discuss meds but says that his granddaughter sets up his medications but he knows that he did stop the Dyazide started the Lasix 20 mg this morning.  Questioned about appointments he states he knows he has appointments with his doctors in April and May but did not know the dates.  He had no further questions at this time.   Tresa Endo RN [facility]

## 2020-11-21 ENCOUNTER — Ambulatory Visit: Payer: Medicare Other | Admitting: Family

## 2021-01-11 ENCOUNTER — Inpatient Hospital Stay
Admit: 2021-01-11 | Discharge: 2021-01-11 | Disposition: A | Discharge status: Home / Self Care | Payer: Medicare Other | Attending: Internal Medicine | Admitting: Internal Medicine

## 2021-01-11 ENCOUNTER — Other Ambulatory Visit: Payer: Self-pay

## 2021-01-11 ENCOUNTER — Inpatient Hospital Stay: Payer: Medicare Other

## 2021-01-11 ENCOUNTER — Emergency Department: Payer: Medicare Other

## 2021-01-11 ENCOUNTER — Inpatient Hospital Stay
Admission: EM | Admit: 2021-01-11 | Discharge: 2021-01-14 | DRG: 291 | Disposition: A | Discharge status: Home Health | Payer: Medicare Other | Attending: Internal Medicine | Admitting: Internal Medicine

## 2021-01-11 DIAGNOSIS — Z96653 Presence of artificial knee joint, bilateral: Secondary | ICD-10-CM | POA: Diagnosis present

## 2021-01-11 DIAGNOSIS — F32A Depression, unspecified: Secondary | ICD-10-CM | POA: Diagnosis present

## 2021-01-11 DIAGNOSIS — Z7982 Long term (current) use of aspirin: Secondary | ICD-10-CM

## 2021-01-11 DIAGNOSIS — Z66 Do not resuscitate: Secondary | ICD-10-CM | POA: Diagnosis present

## 2021-01-11 DIAGNOSIS — J9811 Atelectasis: Secondary | ICD-10-CM | POA: Diagnosis present

## 2021-01-11 DIAGNOSIS — J449 Chronic obstructive pulmonary disease, unspecified: Secondary | ICD-10-CM | POA: Diagnosis present

## 2021-01-11 DIAGNOSIS — Z20822 Contact with and (suspected) exposure to covid-19: Secondary | ICD-10-CM | POA: Diagnosis present

## 2021-01-11 DIAGNOSIS — Z6841 Body Mass Index (BMI) 40.0 and over, adult: Secondary | ICD-10-CM

## 2021-01-11 DIAGNOSIS — I4821 Permanent atrial fibrillation: Secondary | ICD-10-CM | POA: Diagnosis present

## 2021-01-11 DIAGNOSIS — Z885 Allergy status to narcotic agent status: Secondary | ICD-10-CM | POA: Diagnosis not present

## 2021-01-11 DIAGNOSIS — Z79899 Other long term (current) drug therapy: Secondary | ICD-10-CM | POA: Diagnosis not present

## 2021-01-11 DIAGNOSIS — E785 Hyperlipidemia, unspecified: Secondary | ICD-10-CM | POA: Diagnosis present

## 2021-01-11 DIAGNOSIS — J90 Pleural effusion, not elsewhere classified: Secondary | ICD-10-CM

## 2021-01-11 DIAGNOSIS — J9601 Acute respiratory failure with hypoxia: Secondary | ICD-10-CM

## 2021-01-11 DIAGNOSIS — I5033 Acute on chronic diastolic (congestive) heart failure: Secondary | ICD-10-CM | POA: Diagnosis present

## 2021-01-11 DIAGNOSIS — J9610 Chronic respiratory failure, unspecified whether with hypoxia or hypercapnia: Secondary | ICD-10-CM | POA: Diagnosis present

## 2021-01-11 DIAGNOSIS — J918 Pleural effusion in other conditions classified elsewhere: Secondary | ICD-10-CM | POA: Diagnosis present

## 2021-01-11 DIAGNOSIS — I5031 Acute diastolic (congestive) heart failure: Secondary | ICD-10-CM

## 2021-01-11 DIAGNOSIS — I4891 Unspecified atrial fibrillation: Secondary | ICD-10-CM | POA: Diagnosis present

## 2021-01-11 DIAGNOSIS — I110 Hypertensive heart disease with heart failure: Principal | ICD-10-CM | POA: Diagnosis present

## 2021-01-11 DIAGNOSIS — Z9981 Dependence on supplemental oxygen: Secondary | ICD-10-CM | POA: Diagnosis not present

## 2021-01-11 DIAGNOSIS — I509 Heart failure, unspecified: Secondary | ICD-10-CM

## 2021-01-11 DIAGNOSIS — Z8249 Family history of ischemic heart disease and other diseases of the circulatory system: Secondary | ICD-10-CM

## 2021-01-11 DIAGNOSIS — J9600 Acute respiratory failure, unspecified whether with hypoxia or hypercapnia: Secondary | ICD-10-CM | POA: Diagnosis present

## 2021-01-11 DIAGNOSIS — I160 Hypertensive urgency: Secondary | ICD-10-CM | POA: Diagnosis present

## 2021-01-11 DIAGNOSIS — J9621 Acute and chronic respiratory failure with hypoxia: Secondary | ICD-10-CM | POA: Diagnosis present

## 2021-01-11 DIAGNOSIS — Z9889 Other specified postprocedural states: Secondary | ICD-10-CM

## 2021-01-11 DIAGNOSIS — I452 Bifascicular block: Secondary | ICD-10-CM | POA: Diagnosis present

## 2021-01-11 DIAGNOSIS — L8990 Pressure ulcer of unspecified site, unspecified stage: Secondary | ICD-10-CM | POA: Insufficient documentation

## 2021-01-11 LAB — CBC WITH DIFFERENTIAL/PLATELET
Abs Immature Granulocytes: 0.04 10*3/uL (ref 0.00–0.07)
Basophils Absolute: 0.1 10*3/uL (ref 0.0–0.1)
Basophils Relative: 1 %
Eosinophils Absolute: 0.2 10*3/uL (ref 0.0–0.5)
Eosinophils Relative: 2 %
HCT: 44.5 % (ref 39.0–52.0)
Hemoglobin: 14.3 g/dL (ref 13.0–17.0)
Immature Granulocytes: 0 %
Lymphocytes Relative: 40 %
Lymphs Abs: 4.6 10*3/uL — ABNORMAL HIGH (ref 0.7–4.0)
MCH: 31.1 pg (ref 26.0–34.0)
MCHC: 32.1 g/dL (ref 30.0–36.0)
MCV: 96.7 fL (ref 80.0–100.0)
Monocytes Absolute: 1 10*3/uL (ref 0.1–1.0)
Monocytes Relative: 9 %
Neutro Abs: 5.6 10*3/uL (ref 1.7–7.7)
Neutrophils Relative %: 48 %
Platelets: 208 10*3/uL (ref 150–400)
RBC: 4.6 MIL/uL (ref 4.22–5.81)
RDW: 14 % (ref 11.5–15.5)
WBC: 11.4 10*3/uL — ABNORMAL HIGH (ref 4.0–10.5)
nRBC: 0 % (ref 0.0–0.2)

## 2021-01-11 LAB — COMPREHENSIVE METABOLIC PANEL
ALT: 10 U/L (ref 0–44)
AST: 21 U/L (ref 15–41)
Albumin: 3.8 g/dL (ref 3.5–5.0)
Alkaline Phosphatase: 69 U/L (ref 38–126)
Anion gap: 12 (ref 5–15)
BUN: 10 mg/dL (ref 8–23)
CO2: 34 mmol/L — ABNORMAL HIGH (ref 22–32)
Calcium: 9 mg/dL (ref 8.9–10.3)
Chloride: 92 mmol/L — ABNORMAL LOW (ref 98–111)
Creatinine, Ser: 0.94 mg/dL (ref 0.61–1.24)
GFR, Estimated: >60 mL/min (ref >60)
Glucose, Bld: 132 mg/dL — ABNORMAL HIGH (ref 70–99)
Potassium: 3.5 mmol/L (ref 3.5–5.1)
Sodium: 138 mmol/L (ref 135–145)
Total Bilirubin: 1 mg/dL (ref 0.3–1.2)
Total Protein: 7.5 g/dL (ref 6.5–8.1)

## 2021-01-11 LAB — ECHOCARDIOGRAM COMPLETE
AR max vel: 1.73 cm2
AV Area VTI: 1.8 cm2
AV Area mean vel: 1.74 cm2
AV Mean grad: 6 mmHg
AV Peak grad: 11.1 mmHg
Ao pk vel: 1.66 m/s
Area-P 1/2: 6.65 cm2
Height: 67 in
S' Lateral: 3.1 cm
Weight: 4320 oz

## 2021-01-11 LAB — TROPONIN I (HIGH SENSITIVITY)
Troponin I (High Sensitivity): 15 ng/L (ref <18)
Troponin I (High Sensitivity): 27 ng/L — ABNORMAL HIGH (ref <18)

## 2021-01-11 LAB — RESP PANEL BY RT-PCR (FLU A&B, COVID) ARPGX2
Influenza A by PCR: NEGATIVE
Influenza B by PCR: NEGATIVE
SARS Coronavirus 2 by RT PCR: NEGATIVE

## 2021-01-11 LAB — PROCALCITONIN: Procalcitonin: <0.1 ng/mL

## 2021-01-11 LAB — BRAIN NATRIURETIC PEPTIDE: B Natriuretic Peptide: 628.1 pg/mL — ABNORMAL HIGH (ref 0.0–100.0)

## 2021-01-11 MED ORDER — LORATADINE 10 MG PO TABS
10.0000 mg | ORAL_TABLET | Freq: Every day | ORAL | Status: DC
  Administered 2021-01-11 – 2021-01-14 (×4): 10 mg via ORAL
  Filled 2021-01-11 (×4): qty 1

## 2021-01-11 MED ORDER — ATORVASTATIN CALCIUM 10 MG PO TABS
10.0000 mg | ORAL_TABLET | Freq: Every day | ORAL | Status: DC
  Administered 2021-01-11 – 2021-01-13 (×3): 10 mg via ORAL
  Filled 2021-01-11 (×3): qty 1

## 2021-01-11 MED ORDER — ENOXAPARIN SODIUM 60 MG/0.6ML IJ SOSY
60.0000 mg | PREFILLED_SYRINGE | INTRAMUSCULAR | Status: DC
  Administered 2021-01-11 – 2021-01-12 (×2): 60 mg via SUBCUTANEOUS
  Filled 2021-01-11 (×2): qty 0.6

## 2021-01-11 MED ORDER — ADULT MULTIVITAMIN W/MINERALS CH
1.0000 | ORAL_TABLET | Freq: Every day | ORAL | Status: DC
  Administered 2021-01-11 – 2021-01-14 (×4): 1 via ORAL
  Filled 2021-01-11 (×4): qty 1

## 2021-01-11 MED ORDER — SODIUM CHLORIDE 0.9% FLUSH
3.0000 mL | Freq: Two times a day (BID) | INTRAVENOUS | Status: DC
  Administered 2021-01-11 – 2021-01-14 (×7): 3 mL via INTRAVENOUS

## 2021-01-11 MED ORDER — BUTALBITAL-APAP-CAFFEINE 50-325-40 MG PO TABS: 1.0000 | ORAL_TABLET | Freq: Four times a day (QID) | ORAL | Status: DC | PRN

## 2021-01-11 MED ORDER — FUROSEMIDE 10 MG/ML IJ SOLN
40.0000 mg | Freq: Every day | INTRAMUSCULAR | Status: DC
  Administered 2021-01-12 – 2021-01-14 (×3): 40 mg via INTRAVENOUS
  Filled 2021-01-11 (×3): qty 4

## 2021-01-11 MED ORDER — ONDANSETRON HCL 4 MG/2ML IJ SOLN: 4.0000 mg | Freq: Four times a day (QID) | INTRAMUSCULAR | Status: DC | PRN

## 2021-01-11 MED ORDER — IOHEXOL 350 MG/ML SOLN
75.0000 mL | Freq: Once | INTRAVENOUS | Status: AC | PRN
  Administered 2021-01-11: 75 mL via INTRAVENOUS

## 2021-01-11 MED ORDER — IPRATROPIUM-ALBUTEROL 0.5-2.5 (3) MG/3ML IN SOLN
RESPIRATORY_TRACT | Status: AC
  Administered 2021-01-11: 3 mL
  Filled 2021-01-11: qty 9

## 2021-01-11 MED ORDER — FINASTERIDE 5 MG PO TABS
5.0000 mg | ORAL_TABLET | Freq: Every day | ORAL | Status: DC
  Administered 2021-01-11 – 2021-01-14 (×4): 5 mg via ORAL
  Filled 2021-01-11 (×5): qty 1

## 2021-01-11 MED ORDER — GABAPENTIN 100 MG PO CAPS
100.0000 mg | ORAL_CAPSULE | Freq: Two times a day (BID) | ORAL | Status: DC
  Administered 2021-01-11 – 2021-01-14 (×7): 100 mg via ORAL
  Filled 2021-01-11 (×7): qty 1

## 2021-01-11 MED ORDER — GLUCOSAMINE SULFATE 1000 MG PO CAPS: 1000.0000 mg | ORAL_CAPSULE | Freq: Two times a day (BID) | ORAL | Status: DC

## 2021-01-11 MED ORDER — CITALOPRAM HYDROBROMIDE 20 MG PO TABS
10.0000 mg | ORAL_TABLET | Freq: Every day | ORAL | Status: DC
  Administered 2021-01-11 – 2021-01-14 (×4): 10 mg via ORAL
  Filled 2021-01-11 (×4): qty 1

## 2021-01-11 MED ORDER — IPRATROPIUM-ALBUTEROL 0.5-2.5 (3) MG/3ML IN SOLN: 3.0000 mL | Freq: Four times a day (QID) | RESPIRATORY_TRACT | Status: DC | PRN

## 2021-01-11 MED ORDER — ASPIRIN EC 325 MG PO TBEC
325.0000 mg | DELAYED_RELEASE_TABLET | Freq: Every day | ORAL | Status: DC
  Administered 2021-01-11 – 2021-01-14 (×4): 325 mg via ORAL
  Filled 2021-01-11 (×4): qty 1

## 2021-01-11 MED ORDER — SODIUM CHLORIDE 0.9 % IV SOLN
500.0000 mg | Freq: Once | INTRAVENOUS | Status: AC
  Administered 2021-01-11: 500 mg via INTRAVENOUS
  Filled 2021-01-11: qty 500

## 2021-01-11 MED ORDER — PSYLLIUM 95 % PO PACK
1.0000 | PACK | Freq: Every day | ORAL | Status: DC
  Administered 2021-01-12 – 2021-01-14 (×3): 1 via ORAL
  Filled 2021-01-11 (×4): qty 1

## 2021-01-11 MED ORDER — BUTALBITAL-APAP-CAFFEINE 50-325-40 MG PO CAPS: 1.0000 | ORAL_CAPSULE | Freq: Every day | ORAL | Status: DC | PRN

## 2021-01-11 MED ORDER — FUROSEMIDE 10 MG/ML IJ SOLN
40.0000 mg | Freq: Once | INTRAMUSCULAR | Status: AC
  Administered 2021-01-11: 40 mg via INTRAVENOUS
  Filled 2021-01-11: qty 4

## 2021-01-11 MED ORDER — AMLODIPINE BESYLATE 5 MG PO TABS
5.0000 mg | ORAL_TABLET | Freq: Every day | ORAL | Status: DC
  Administered 2021-01-11 – 2021-01-12 (×2): 5 mg via ORAL
  Filled 2021-01-11 (×2): qty 1

## 2021-01-11 MED ORDER — SODIUM CHLORIDE 0.9 % IV SOLN
1.0000 g | Freq: Once | INTRAVENOUS | Status: AC
  Administered 2021-01-11: 1 g via INTRAVENOUS
  Filled 2021-01-11: qty 10

## 2021-01-11 MED ORDER — SODIUM CHLORIDE 0.9 % IV SOLN: 250.0000 mL | INTRAVENOUS | Status: DC | PRN

## 2021-01-11 MED ORDER — ACETAMINOPHEN 325 MG PO TABS: 650.0000 mg | ORAL_TABLET | ORAL | Status: DC | PRN

## 2021-01-11 MED ORDER — SODIUM CHLORIDE 0.9% FLUSH: 3.0000 mL | INTRAVENOUS | Status: DC | PRN

## 2021-01-11 MED ORDER — VITAMIN D 25 MCG (1000 UNIT) PO TABS
1000.0000 [IU] | ORAL_TABLET | Freq: Every day | ORAL | Status: DC
  Administered 2021-01-11 – 2021-01-14 (×4): 1000 [IU] via ORAL
  Filled 2021-01-11 (×4): qty 1

## 2021-01-11 MED ORDER — OMEGA-3-ACID ETHYL ESTERS 1 G PO CAPS
1.0000 g | ORAL_CAPSULE | Freq: Two times a day (BID) | ORAL | Status: DC
  Administered 2021-01-11 – 2021-01-14 (×7): 1 g via ORAL
  Filled 2021-01-11 (×8): qty 1

## 2021-01-11 MED ORDER — METHYLPREDNISOLONE SODIUM SUCC 125 MG IJ SOLR
INTRAMUSCULAR | Status: AC
  Administered 2021-01-11: 125 mg
  Filled 2021-01-11: qty 2

## 2021-01-11 MED ORDER — MIRTAZAPINE 15 MG PO TABS
15.0000 mg | ORAL_TABLET | Freq: Every day | ORAL | Status: DC
  Administered 2021-01-11 – 2021-01-13 (×3): 15 mg via ORAL
  Filled 2021-01-11 (×3): qty 1

## 2021-01-11 MED ORDER — METOPROLOL TARTRATE 50 MG PO TABS
50.0000 mg | ORAL_TABLET | Freq: Two times a day (BID) | ORAL | Status: DC
  Administered 2021-01-11 – 2021-01-14 (×6): 50 mg via ORAL
  Filled 2021-01-11 (×7): qty 1

## 2021-01-11 NOTE — Consult Note (Signed)
[facility] Card[facility] Consultation Note  [name], MRN: [id number], DOB/AGE: [birth year] 85 y.o. Admit date: 01/11/2021   Date of Consult: 01/11/2021 Primary Physician: Mick Sell, MD Primary Cardiologist: None  Chief Complaint:  Chief Complaint  Patient presents with   Shortness of Breath   Reason for Consult:  Heart failure  HPI: 87 y.o. male with known chronic nonvalvular atrial fibrillation hypertension hyperlipidemia and COPD on 2 L of oxygen having progression of issues of significant lower extremity edema PND orthopnea shortness of breath over the last several days to a week.  The patient was seen in the emergency room at which time he had a BNP of 628 and a troponin of 15/27.  Chest x-ray shows congestive heart failure and he did have some hypoxia.  Patient did have some improvements with intravenous Lasix with good urine output.  His EKG does show atrial fibrillation with left axis deviation and right bundle branch block but does not have any significant amount of anginal symptoms at this time  Past Medical History:  Diagnosis Date   Atrial fibrillation (HCC)    COPD (chronic obstructive pulmonary disease) (HCC)    Hypercholesteremia    Hypertension       Surgical History:  Past Surgical History:  Procedure Laterality Date   CHOLECYSTECTOMY     REPLACEMENT TOTAL KNEE Bilateral      Home Meds: Prior to Admission medications   Medication Sig Start Date End Date Taking? Authorizing Provider  amLODipine (NORVASC) 5 MG tablet Take 5 mg by mouth daily.   Yes [provider]  aspirin 325 MG tablet Take 325 mg by mouth daily.   Yes [provider]  atorvastatin (LIPITOR) 10 MG tablet Take 10 mg by mouth daily.   Yes [provider]  Butalbital-APAP-Caffeine [id number] MG capsule Take 1 capsule by mouth daily as needed for headache. 08/17/19  Yes [provider]  Cholecalciferol 25 MCG (1000 UT) capsule Take 1,000  Units by mouth daily.   Yes [provider]  citalopram (CELEXA) 10 MG tablet Take 10 mg by mouth daily.   Yes [provider]  fexofenadine (ALLEGRA) 180 MG tablet Take 60 mg by mouth daily.   Yes [provider]  finasteride (PROSCAR) 5 MG tablet Take 5 mg by mouth daily.   Yes [provider]  fluticasone (FLONASE) 50 MCG/ACT nasal spray Place 1 spray into both nostrils daily.   Yes [provider]  furosemide (LASIX) 40 MG tablet Take 40 mg by mouth daily.   Yes [provider]  gabapentin (NEURONTIN) 100 MG capsule Take 100 mg by mouth 2 (two) times daily. 09/12/20  Yes [provider]  Glucosamine Sulfate 1000 MG CAPS Take 1 tablet by mouth 2 (two) times daily.   Yes [provider]  metoprolol tartrate (LOPRESSOR) 50 MG tablet Take 50 mg by mouth 2 (two) times daily. 08/17/20  Yes [provider]  mirtazapine (REMERON) 15 MG tablet Take 15 mg by mouth at bedtime. 11/02/20  Yes [provider]  Multiple Vitamin (MULTI-VITAMIN) tablet Take 1 tablet by mouth daily.   Yes [provider]  Omega-3 Fatty Acids (FISH OIL) 1000 MG CPDR Take 1 capsule by mouth in the morning and at bedtime.   Yes [provider]  psyllium (METAMUCIL) 58.6 % packet Take 1 packet by mouth daily.   Yes [provider]  furosemide (LASIX) 20 MG tablet Take 1 tablet (20 mg total) by  mouth daily. Patient not taking: No sig reported 11/14/20   Enedina Finner, MD    Inpatient Medications:   amLODipine  5 mg Oral Daily   aspirin EC  325 mg Oral Daily   atorvastatin  10 mg Oral QHS   cholecalciferol  1,000 Units Oral Daily   citalopram  10 mg Oral Daily   enoxaparin (LOVENOX) injection  60 mg Subcutaneous Q24H   finasteride  5 mg Oral Daily   [START ON 01/12/2021] furosemide  40 mg Intravenous Daily   gabapentin  100 mg Oral BID   loratadine  10 mg Oral Daily   metoprolol tartrate  50 mg Oral BID   mirtazapine   15 mg Oral QHS   multivitamin with minerals  1 tablet Oral Daily   omega-3 acid ethyl esters  1 g Oral BID   psyllium  1 packet Oral Daily   sodium chloride flush  3 mL Intravenous Q12H    sodium chloride      Allergies:  Allergies  Allergen Reactions   Codeine Other (See Comments)   Morphine And Related Other (See Comments)    Social History   Socioeconomic History   Marital status: Widowed    Spouse name: Not on file   Number of children: Not on file   Years of education: Not on file   Highest education level: Not on file  Occupational History   Not on file  Tobacco Use   Smoking status: Never   Smokeless tobacco: Never  Substance and Sexual Activity   Alcohol use: Never   Drug use: Not Currently   Sexual activity: Not on file  Other Topics Concern   Not on file  Social History Narrative   Not on file   Social Determinants of Health   Financial Resource Strain: Not on file  Food Insecurity: Not on file  Transportation Needs: Not on file  Physical Activity: Not on file  Stress: Not on file  Social Connections: Not on file  Intimate Partner Violence: Not on file     Family History  Problem Relation Age of Onset   Hypertension Mother      Review of Systems Positive for shortness of breath Negative for: General:  chills, fever, night sweats or weight changes.  Cardiovascular: Positive for PND orthopnea negative for syncope dizziness  Dermatological skin lesions rashes Respiratory: Cough congestion Urologic: Frequent urination urination at night and hematuria Abdominal: negative for nausea, vomiting, diarrhea, bright red blood per rectum, melena, or hematemesis Neurologic: negative for visual changes, and/or hearing changes  All other systems reviewed and are otherwise negative except as noted above.  Labs: No results for input(s): CKTOTAL, CKMB, TROPONINI in the last 72 hours. Lab Results  Component Value Date   WBC 11.4 (H) 01/11/2021   HGB 14.3  01/11/2021   HCT 44.5 01/11/2021   MCV 96.7 01/11/2021   PLT 208 01/11/2021    Recent Labs  Lab 01/11/21 0627  NA 138  K 3.5  CL 92*  CO2 34*  BUN 10  CREATININE 0.94  CALCIUM 9.0  PROT 7.5  BILITOT 1.0  ALKPHOS 69  ALT 10  AST 21  GLUCOSE 132*   No results found for: CHOL, HDL, LDLCALC, TRIG No results found for: DDIMER  Radiology/Studies:  DG Chest 1 View  Result Date: 01/11/2021 CLINICAL DATA:  Shortness of breath. diff breathing; bleaky; 15L non rebreaher; rhonchi EXAM: CHEST  1 VIEW COMPARISON:  Chest x-ray 11/10/2020. FINDINGS: Persistent enlarged cardiac silhouette.  The heart size and mediastinal contours are otherwise unchanged. Aortic calcification. Interval development of bilateral nodular lesions:a 6 mm right mid lung zone and a 53mm left mid lung zone noted. Increased interstitial markings. Small to moderate size left pleural effusion. No pneumothorax. No acute osseous abnormality. IMPRESSION: 1. Increased interstitial markings as well as bilateral pulmonary nodules. 2. Small to moderate volume left pleural effusion. 3. Cardiomegaly. 4. Recommend CT chest with intravenous contrast for further evaluation. Electronically Signed   By: Tish Frederickson M.D.   On: 01/11/2021 06:49   CT Angio Chest PE W and/or Wo Contrast  Result Date: 01/11/2021 CLINICAL DATA:  Shortness of breath, cough. EXAM: CT ANGIOGRAPHY CHEST WITH CONTRAST TECHNIQUE: Multidetector CT imaging of the chest was performed using the standard protocol during bolus administration of intravenous contrast. Multiplanar CT image reconstructions and MIPs were obtained to evaluate the vascular anatomy. CONTRAST:  78mL OMNIPAQUE IOHEXOL 350 MG/ML SOLN COMPARISON:  None. FINDINGS: Cardiovascular: No filling defects in the pulmonary arteries to suggest pulmonary emboli. Cardiomegaly. Diffuse coronary artery calcifications and aortic calcifications throughout the aortic arch and descending thoracic aorta. No aneurysm.  Mediastinum/Nodes: No mediastinal, hilar, or axillary adenopathy. Trachea and esophagus are unremarkable. Enlarged left thyroid lobe which extends into the substernal region. Lungs/Pleura: Bilateral moderate to large pleural effusions, left greater than right. Compressive atelectasis in the lower lobes and in the lingula. Upper Abdomen: Imaging into the upper abdomen demonstrates no acute findings. Musculoskeletal: Chest wall soft tissues are unremarkable. No acute bony abnormality. Review of the MIP images confirms the above findings. IMPRESSION: No evidence of pulmonary embolus. Cardiomegaly, diffuse coronary artery disease. Moderate to large bilateral pleural effusions with compressive atelectasis, left greater than right. Enlarged left thyroid lobe with substernal extension. In the setting of significant comorbidities or limited life expectancy, no follow-up recommended (ref: J Am Coll Radiol. 2015 Feb;12(2): 143-50). Aortic Atherosclerosis (ICD10-I70.0). Electronically Signed   By: Charlett Nose M.D.   On: 01/11/2021 10:33   ECHOCARDIOGRAM COMPLETE  Result Date: 01/11/2021    ECHOCARDIOGRAM REPORT   Patient Name:   Brian Lara Date of Exam: 01/11/2021 Medical Rec #:  [id number]        Height:       67.0 in Accession #:    [id number]       Weight:       270.0 lb Date of Birth:  1926-06-03        BSA:          2.297 m Patient Age:    89 years         BP:           122/72 mmHg Patient Gender: M                HR:           67 bpm. Exam Location:  [facility] Procedure: 2D Echo, Cardiac Doppler and Color Doppler Indications:     CHF-acute diastolic I50.31  History:         Patient has no prior history of Echocardiogram examinations.                  COPD, Arrythmias:Atrial Fibrillation; Risk                  Factors:Hypertension.  Sonographer:     Cristela Blue RDCS (AE) Referring Phys:  [id number] Lucile Shutters Diagnosing Phys: Arnoldo Hooker MD  Sonographer Comments: Suboptimal apical window. IMPRESSIONS  1. Left  ventricular ejection fraction,  by estimation, is 50 to 55%. The left ventricle has low normal function. The left ventricle has no regional wall motion abnormalities. Left ventricular diastolic parameters were normal.  2. Right ventricular systolic function is normal. The right ventricular size is normal.  3. Left atrial size was mildly dilated.  4. The mitral valve is normal in structure. Mild to moderate mitral valve regurgitation.  5. The aortic valve is normal in structure. Aortic valve regurgitation is trivial. FINDINGS  Left Ventricle: Left ventricular ejection fraction, by estimation, is 50 to 55%. The left ventricle has low normal function. The left ventricle has no regional wall motion abnormalities. The left ventricular internal cavity size was normal in size. There is no left ventricular hypertrophy. Left ventricular diastolic parameters were normal. Right Ventricle: The right ventricular size is normal. No increase in right ventricular wall thickness. Right ventricular systolic function is normal. Left Atrium: Left atrial size was mildly dilated. Right Atrium: Right atrial size was normal in size. Pericardium: There is no evidence of pericardial effusion. Mitral Valve: The mitral valve is normal in structure. Mild to moderate mitral valve regurgitation. Tricuspid Valve: The tricuspid valve is normal in structure. Tricuspid valve regurgitation is mild. Aortic Valve: The aortic valve is normal in structure. Aortic valve regurgitation is trivial. Aortic valve mean gradient measures 6.0 mmHg. Aortic valve peak gradient measures 11.1 mmHg. Aortic valve area, by VTI measures 1.80 cm. Pulmonic Valve: The pulmonic valve was normal in structure. Pulmonic valve regurgitation is trivial. Aorta: The aortic root and ascending aorta are structurally normal, with no evidence of dilitation. IAS/Shunts: No atrial level shunt detected by color flow Doppler.  LEFT VENTRICLE PLAX 2D LVIDd:         4.28 cm  Diastology LVIDs:          3.10 cm  LV e' medial:    6.96 cm/s LV PW:         1.86 cm  LV E/e' medial:  14.5 LV IVS:        1.58 cm  LV e' lateral:   8.16 cm/s LVOT diam:     2.00 cm  LV E/e' lateral: 12.4 LV SV:         58 LV SV Index:   25 LVOT Area:     3.14 cm  RIGHT VENTRICLE RV Basal diam:  3.32 cm LEFT ATRIUM            Index       RIGHT ATRIUM           Index LA diam:      5.40 cm  2.35 cm/m  RA Area:     33.80 cm LA Vol (A4C): 126.0 ml 54.85 ml/m RA Volume:   114.00 ml 49.62 ml/m  AORTIC VALVE                    PULMONIC VALVE AV Area (Vmax):    1.73 cm     PV Vmax:        0.78 m/s AV Area (Vmean):   1.74 cm     PV Peak grad:   2.4 mmHg AV Area (VTI):     1.80 cm     RVOT Peak grad: 4 mmHg AV Vmax:           166.33 cm/s AV Vmean:          113.667 cm/s AV VTI:            0.323 m AV Peak  Grad:      11.1 mmHg AV Mean Grad:      6.0 mmHg LVOT Vmax:         91.40 cm/s LVOT Vmean:        63.000 cm/s LVOT VTI:          0.185 m LVOT/AV VTI ratio: 0.57  AORTA Ao Root diam: 3.10 cm MITRAL VALVE                TRICUSPID VALVE MV Area (PHT): 6.65 cm     TR Peak grad:   25.0 mmHg MV Decel Time: 114 msec     TR Vmax:        250.00 cm/s MV E velocity: 101.00 cm/s MV A velocity: 54.80 cm/s   SHUNTS MV E/A ratio:  1.84         Systemic VTI:  0.18 m                             Systemic Diam: 2.00 cm Arnoldo HookerBruce Rubena Roseman MD Electronically signed by Arnoldo HookerBruce Kherington Meraz MD Signature Date/Time: 01/11/2021/5:19:32 PM    Final     EKG: Atrial fibrillation with left axis deviation right bundle branch block  Weights: Filed Weights   01/11/21 0627  Weight: 122.5 kg     Physical Exam: Blood pressure (!) 167/75, pulse 73, temperature 98.6 F (37 C), temperature source Oral, resp. rate (!) 29, height 5\' 7"  (1.702 m), weight 122.5 kg, SpO2 96 %. Body mass index is 42.29 kg/m. General: Well developed, well nourished, in no acute distress. Head eyes ears nose throat: Normocephalic, atraumatic, sclera non-icteric, no xanthomas, nares are without  discharge. No apparent thyromegaly and/or mass  Lungs: Normal respiratory effort.   wheezes, basilar rales, no rhonchi.  Heart: Giller few with normal S1 S2. no murmur gallop, no rub, PMI is normal size and placement, carotid upstroke normal without bruit, jugular venous pressure is normal Abdomen: Soft, non-tender, non-distended with normoactive bowel sounds. No hepatomegaly. No rebound/guarding. No obvious abdominal masses. Abdominal aorta is normal size without bruit Extremities: 1+ edema. no cyanosis, no clubbing, no ulcers  Peripheral : 2+ bilateral upper extremity pulses, 2+ bilateral femoral pulses, 2+ bilateral dorsal pedal pulse Neuro: Alert and oriented. No facial asymmetry. No focal deficit. Moves all extremities spontaneously. Musculoskeletal: Normal muscle tone without kyphosis Psych:  Responds to questions appropriately with a normal affect.    Assessment: 87 year old male with chronic nonvalvular atrial fibrillation with acute on chronic diastolic dysfunction congestive heart failure without evidence of myocardial infarction  Plan: 1.  Continue intravenous Lasix for further treatment of acute on chronic diastolic dysfunction congestive heart failure 2.  Echocardiogram for LV systolic dysfunction valvular heart disease contributing to above 3.  No anticoagulation at this time due to patient's previous concerns for bleeding complications as well as fall risk 4.  Further treatment options after further treatment option of.  Rate  Signed, Lamar BlinksBruce J Alden Feagan M.D. [facility] 01/11/2021, 6:18 PM

## 2021-01-11 NOTE — ED Provider Notes (Signed)
[facility] Emergency Department Provider Note  ____________________________________________  Time seen: Approximately 6:32 AM  I have reviewed the triage vital signs and the nursing notes.   HISTORY  Chief Complaint Shortness of Breath   HPI Brian Lara is a 87 y.o. male with a history of COPD, atrial fibrillation not anticoagulated, CHF with a EF of greater than 55% from echo from 2017, hypertension and hyperlipidemia who presents for evaluation of respiratory distress.  Patient reports that he woke up this morning in severe respiratory distress.  Satting in the mid to upper 80s on room air.  Patient reports that he was feeling well when he went to bed last night.  Denies any dietary indiscretions.  Denies cough, fever, body aches.  Denies any chest pain.  No personal or family history of PE or DVT, no recent travel or immobilization, no leg pain, no hemoptysis.  Patient Dors is compliance with his medications  Past Medical History:  Diagnosis Date   Atrial fibrillation (HCC)    COPD (chronic obstructive pulmonary disease) (HCC)    Hypercholesteremia    Hypertension     Patient Active Problem List   Diagnosis Date Noted   Acute CHF (congestive heart failure) (HCC) 11/10/2020   COPD exacerbation (HCC) 11/10/2020   Hypertension    Hypercholesteremia    Atrial fibrillation (HCC)    Acute respiratory failure with hypoxia (HCC)    Depression    CKD (chronic kidney disease), stage IIIa     Past Surgical History:  Procedure Laterality Date   CHOLECYSTECTOMY     REPLACEMENT TOTAL KNEE Bilateral     Prior to Admission medications   Medication Sig Start Date End Date Taking? Authorizing Provider  amLODipine (NORVASC) 5 MG tablet Take 5 mg by mouth daily.    [provider]  aspirin 325 MG tablet Take 325 mg by mouth daily.    [provider]  atorvastatin (LIPITOR) 10 MG tablet Take 10 mg by mouth daily.    [provider]  Butalbital-APAP-Caffeine [phone] MG capsule Take 1 capsule by mouth daily as needed for headache. 08/17/19   [provider]  Cholecalciferol 25 MCG (1000 UT) capsule Take 1,000 Units by mouth daily.    [provider]  citalopram (CELEXA) 10 MG tablet Take 10 mg by mouth daily.    [provider]  fexofenadine (ALLEGRA) 180 MG tablet Take 60 mg by mouth daily.    [provider]  finasteride (PROSCAR) 5 MG tablet Take 5 mg by mouth daily.    [provider]  fluticasone (FLONASE) 50 MCG/ACT nasal spray Place 1 spray into both nostrils daily.    [provider]  furosemide (LASIX) 20 MG tablet Take 1 tablet (20 mg total) by mouth daily. 11/14/20   Enedina Finner, MD  gabapentin (NEURONTIN) 100 MG capsule Take 100 mg by mouth 2 (two) times daily. 09/12/20   [provider]  Glucosamine Sulfate 1000 MG CAPS Take 1 tablet by mouth 2 (two) times daily.    [provider]  metoprolol tartrate (LOPRESSOR) 50 MG tablet Take 50 mg by mouth 2 (two) times daily. 08/17/20   [provider]  mirtazapine (REMERON) 15 MG tablet Take 15 mg by mouth at bedtime. 11/02/20   [provider]  Multiple Vitamin (MULTI-VITAMIN) tablet Take 1 tablet by mouth daily.    [provider]  Omega-3 Fatty Acids (FISH OIL) 1000 MG CPDR Take 1 capsule by mouth in  the morning and at bedtime.    [provider]  psyllium (METAMUCIL) 58.6 % packet Take 1 packet by mouth daily.    [provider]    Allergies Codeine and Morphine and related  History reviewed. No pertinent family history.  Social History Social History   Tobacco Use   Smoking status: Never   Smokeless tobacco: Never  Substance Use Topics   Alcohol use: Never   Drug use: Not Currently    Review of Systems  Constitutional: Negative for fever. Eyes: Negative for visual changes. ENT: Negative for sore throat. Neck: No neck pain   Cardiovascular: Negative for chest pain. Respiratory: + shortness of breath. Gastrointestinal: Negative for abdominal pain, vomiting or diarrhea. Genitourinary: Negative for dysuria. Musculoskeletal: Negative for back pain. Skin: Negative for rash. Neurological: Negative for headaches, weakness or numbness. Psych: No SI or HI  ____________________________________________   PHYSICAL EXAM:  VITAL SIGNS: ED Triage Vitals  Enc Vitals Group     BP 01/11/21 0626 (!) 184/122     Pulse Rate 01/11/21 0626 90     Resp 01/11/21 0626 (!) 32     Temp 01/11/21 0626 98.6 F (37 C)     Temp Source 01/11/21 0626 Oral     SpO2 01/11/21 0626 100 %     Weight 01/11/21 0627 270 lb (122.5 kg)     Height 01/11/21 0627 5\' 7"  (1.702 m)     Head Circumference --      Peak Flow --      Pain Score 01/11/21 0627 0     Pain Loc --      Pain Edu? --      Excl. in GC? --     Constitutional: Alert and oriented, moderate respiratory distress. HEENT:      Head: Normocephalic and atraumatic.         Eyes: Conjunctivae are normal. Sclera is non-icteric.       Mouth/Throat: Mucous membranes are moist.       Neck: Supple with no signs of meningismus. Cardiovascular: Irregularly irregular rhythm with normal rate, elevated JVD Respiratory: Tachypneic, hypoxic, on a nonrebreather, diminished breath sounds on the left basal and crackles on the right.  Gastrointestinal: Soft, non tender. Musculoskeletal: 2+ pitting edema bilateral  neurologic: Normal speech and language. Face is symmetric. Moving all extremities. No gross focal neurologic deficits are appreciated. Skin: Skin is warm, dry and intact. No rash noted. Psychiatric: Mood and affect are normal. Speech and behavior are normal.  ____________________________________________   LABS (all labs ordered are listed, but only abnormal results are displayed)  Labs Reviewed  CBC WITH DIFFERENTIAL/PLATELET - Abnormal; Notable for the following components:       Result Value   WBC 11.4 (*)    Lymphs Abs 4.6 (*)    All other components within normal limits  COMPREHENSIVE METABOLIC PANEL - Abnormal; Notable for the following components:   Chloride 92 (*)    CO2 34 (*)    Glucose, Bld 132 (*)    All other components within normal limits  BRAIN NATRIURETIC PEPTIDE - Abnormal; Notable for the following components:   B Natriuretic Peptide 628.1 (*)    All other components within normal limits  RESP PANEL BY RT-PCR (FLU A&B, COVID) ARPGX2  PROCALCITONIN  TROPONIN I (HIGH SENSITIVITY)   ____________________________________________  EKG  ED ECG REPORT I, Nita Sicklearolina Tarri Guilfoil, the attending physician, personally viewed and interpreted this ECG.  Atrial fibrillation with a rate of 101, borderline prolonged  QTC, right bundle branch block, no ST elevations.  Unchanged from prior. ____________________________________________  RADIOLOGY  I have personally reviewed the images performed during this visit and I agree with the Radiologist's read.   Interpretation by Radiologist:  DG Chest 1 View  Result Date: 01/11/2021 CLINICAL DATA:  Shortness of breath. diff breathing; bleaky; 15L non rebreaher; rhonchi EXAM: CHEST  1 VIEW COMPARISON:  Chest x-ray 11/10/2020. FINDINGS: Persistent enlarged cardiac silhouette. The heart size and mediastinal contours are otherwise unchanged. Aortic calcification. Interval development of bilateral nodular lesions:a 6 mm right mid lung zone and a 77mm left mid lung zone noted. Increased interstitial markings. Small to moderate size left pleural effusion. No pneumothorax. No acute osseous abnormality. IMPRESSION: 1. Increased interstitial markings as well as bilateral pulmonary nodules. 2. Small to moderate volume left pleural effusion. 3. Cardiomegaly. 4. Recommend CT chest with intravenous contrast for further evaluation. Electronically Signed   By: Tish Frederickson M.D.   On: 01/11/2021 06:49      ____________________________________________   PROCEDURES  Procedure(s) performed:yes .1-3 Lead EKG Interpretation  Date/Time: 01/11/2021 6:35 AM Performed by: Nita Sickle, MD Authorized by: Nita Sickle, MD     Interpretation: abnormal     ECG rate assessment: normal     Rhythm: atrial fibrillation     Ectopy: none     Conduction: abnormal     Critical Care performed: yes  CRITICAL CARE Performed by: Nita Sickle  ?  Total critical care time: 35 min  Critical care time was exclusive of separately billable procedures and treating other patients.  Critical care was necessary to treat or prevent imminent or life-threatening deterioration.  Critical care was time spent personally by me on the following activities: development of treatment plan with patient and/or surrogate as well as nursing, discussions with consultants, evaluation of patient's response to treatment, examination of patient, obtaining history from patient or surrogate, ordering and performing treatments and interventions, ordering and review of laboratory studies, ordering and review of radiographic studies, pulse oximetry and re-evaluation of patient's condition.  ____________________________________________   INITIAL IMPRESSION / ASSESSMENT AND PLAN / ED COURSE  87 y.o. male with a history of COPD, atrial fibrillation not anticoagulated, CHF with a EF of greater than 55% from echo from 2017, hypertension and hyperlipidemia who presents for evaluation of respiratory distress.  Patient arrives in moderate respiratory distress on a nonrebreather, looks extremely volume overloaded with elevated JVD, crackles, 2+ pitting edema bilaterally.  Placed immediately on BiPAP.  X-ray showing pulmonary edema and pleural effusion.  Patient given 40 mg of IV Lasix, started on 3 duo nebs and Solu-Medrol.  COVID and flu pending.  EKG showing A. fib with well-controlled rate and no acute ischemic changes.  Labs  pending.  Anticipate admission.      _____________________________________________ Please note:  Patient was evaluated in Emergency Department today for the symptoms described in the history of present illness. Patient was evaluated in the context of the global COVID-19 pandemic, which necessitated consideration that the patient might be at risk for infection with the SARS-CoV-2 virus that causes COVID-19. Institutional protocols and algorithms that pertain to the evaluation of patients at risk for COVID-19 are in a state of rapid change based on information released by regulatory bodies including the CDC and federal and state organizations. These policies and algorithms were followed during the patient's care in the ED.  Some ED evaluations and interventions may be delayed as a result of limited staffing during the pandemic.   [place]  Controlled Substance Database was reviewed by me. ____________________________________________   FINAL CLINICAL IMPRESSION(S) / ED DIAGNOSES   Final diagnoses:  Acute respiratory failure with hypoxia (HCC)  Acute on chronic congestive heart failure, unspecified heart failure type (HCC)      NEW MEDICATIONS STARTED DURING THIS VISIT:  ED Discharge Orders     None        Note:  This document was prepared using Dragon voice recognition software and may include unintentional dictation errors.    Don Perking, [place], MD 01/11/21 [phone]

## 2021-01-11 NOTE — Progress Notes (Signed)
PHARMACIST - PHYSICIAN COMMUNICATION  CONCERNING:  Enoxaparin (Lovenox) for DVT Prophylaxis    RECOMMENDATION: Patient was prescribed enoxaprin 40mg  q24 hours for VTE prophylaxis.   Filed Weights   01/11/21 0627  Weight: 122.5 kg (270 lb)    Body mass index is 42.29 kg/m.  Estimated Creatinine Clearance: 62.9 mL/min (by C-G formula based on SCr of 0.94 mg/dL).   Based on [facility] policy patient is candidate for enoxaparin 0.5mg /kg TBW SQ every 24 hours based on BMI being >30.   DESCRIPTION: Pharmacy has adjusted enoxaparin dose per [facility] policy.  Patient is now receiving enoxaparin 60 mg every24 hours    [facility], PharmD, BCPS Clinical Pharmacist  01/11/2021 9:03 AM

## 2021-01-11 NOTE — ED Notes (Signed)
Pt resting comfortably at this time. Pt currently on bipap and states he feels much better since on bipap. All other vitals stable at this time. Pt denies any other needs. Call bell in reach.

## 2021-01-11 NOTE — ED Notes (Signed)
Per Dr. Lucky Rathke she wants to try and wean pt off of bipap to normal 3L/min, FiO2 decreased from 100% to 60%. Will continue to monitor and lower as tolerated.

## 2021-01-11 NOTE — ED Provider Notes (Signed)
I assumed care of approximately 0 700.  Please eval providers note for full details regarding patient's initial evaluation assessment.  In brief patient presents for assessment of shortness of breath and some cough and congestion in setting of history of COPD A. fib and CHF.  Patient is not anticoagulated.  Since she is not on blood thinners no previous records.  On exam he does appear volume overloaded.  Chest x-ray shows pulmonary edema with cardiomegaly and small effusion.  CBC with slightly elevated WBC count at 11.4 and normal hemoglobin.  CMP grossly unremarkable.  Troponin WNL.  Given tachypnea "CONGESTION history of COPD will cover with dose of Rocephin and azithromycin..  Patient is on BiPAP at time of signout.  I will admit to medicine service for further evaluation management.  Will order CTA as recommended by radiology to further evaluate findings on chest x-ray.   Gilles Chiquito, MD 01/11/21 [phone]

## 2021-01-11 NOTE — ED Notes (Signed)
Pt's bipap titrated from 60% to 45% FiO2. Pt tolerating well at this time. Will continue to monitor. Family at bedside.

## 2021-01-11 NOTE — ED Notes (Signed)
Patient transferred from ED stretcher to hospital bed for comfort. Patient external catheter device changed and peri care provided. Barrier cream applied to patient bottom. Patient positioned on R side with pillows. Both IVs flushed and patient. Patient updated on plan of care. Denies other needs at this time.

## 2021-01-11 NOTE — ED Triage Notes (Signed)
diff breathing; bleaky; 15L non rebreaher; rhonchi; takes lasix; 97.7; NSr 186/102; cbg 92

## 2021-01-11 NOTE — ED Notes (Signed)
Pt taken off bipap at this time and placed on Carmen at 5L/min and pt appears to be doing well and no increased distress noted at this time.

## 2021-01-11 NOTE — Consult Note (Addendum)
Heart Failure Nurse Navigator Note  He presented to the ED with complaints of worsening SOB.  At home family noted that he was more fatigued and LLE.  On admission he was hypertensive with blood pressures of 184/122.  By echocardiogram in 2017, EF was 55%. Echocardiogram is pending on this admission.  Comorbidities:   Permament Atrial fibrillation COPD  Hyperlipidemia Hypertension   Chronic use of oxygen of 3 L at home.  Medications:  Furosemide 40 mg  Amlodipine 5 mg daily Metoprolol tartrate 50 mg   Labs:  Sodium 138, potassium 3.5, chloride 92, CO2 34, BUN 10, creatinine 0.94, troponin 15 and 27, BNP 628, hemoglobin 14.3    Assessment:  General-patient is awake and alert lying on gurney in ED.  In no acute distress.  HEENT-wears glasses, normocephalic.  Cardiac-heart tones are regular.  Chest-breath sounds are diminished in bases.  Musculoskeletal-trace edema ,skin over his feet is wrinkled.   Psych-is pleasant and appropriate makes good eye contact.  Neurologic-speech is clear, moves all extremities without difficulty.    Met with patient in the ED, granddaughter April, is at the bedside.  She is the one that  sets up his daily medications.  She states that a couple weeks ago he was on a larger dose of Lasix due to weight gain.  He states at home that they had noted lower extremity swelling.  He religiously weighs himself every day and states that he has not noted a weight gain.  He states that based on his symptoms he had called the cardiology office but had not heard back and with him worsening earlier this morning came into the ED.  He still lives independently in his own little apartment, family brings him his meals.  He does admit to using salt at the table.  Discussed that it would be better for him saltshaker from the table.  After his last discharge from the hospital his daughter had canceled his outpatient heart failure clinic  appointment.   Pricilla Riffle RN [facility]

## 2021-01-11 NOTE — H&P (Signed)
History and Physical    Brian Lara:[id number] DOB: [birth year] DOA: 01/11/2021  PCP: Mick Sell, MD   Patient coming from: Home  I have personally briefly reviewed patient's old medical records in [facility]  Chief Complaint: Shortness of breath  HPI: Brian Lara is a 87 y.o. male with medical history significant for atrial fibrillation not on anticoagulation, COPD with chronic respiratory failure on 3 L of oxygen, history of chronic diastolic dysfunction CHF with last known LVEF of 55%, hypertension and dyslipidemia who was brought into the ER by EMS for evaluation of respiratory distress. Patient states he went to bed in his usual state of health and woke up in the early hours of the morning with difficulty breathing.  He had his oxygen on at 3 L but felt he was not getting enough air.  Per EMS room air pulse oximetry was in the upper 80s.  He has some chest discomfort over the left anterior chest wall but denied having any chest pain.  He has lower extremity swelling.   He denied having any nausea, no vomiting, no palpitations, no diaphoresis.  He denies having any changes in his bowel habits, no abdominal pain, no fever, no chills, no urinary symptoms, no dizziness, no lightheadedness, no headache, no focal deficits, no blurred vision. Labs show sodium 138, potassium 3.5, chloride 92, bicarb 34, glucose 132, BUN 10, creatinine 0.94, calcium 9.0, alkaline phosphatase 69, albumin 3.8, AST 21, ALT 10, total protein 7.5, BNP 628, troponin 15, procalcitonin less than 0.10, white count 11.4, hemoglobin 14.3, hematocrit 44.5, MCV 96.7, RDW 14.0, platelet count 208 Respiratory viral panel is negative Chest x-ray reviewed by me shows increased interstitial markings as well as bilateral pulmonary nodules.  Small to moderate volume left pleural effusion.  Cardiomegaly. Twelve-lead EKG reviewed by me shows atrial fibrillation with a right bundle branch block and a left anterior  fascicular block.  ED Course: Patient is a 87 year old Caucasian male with a history of COPD with chronic respiratory failure on 3 L of oxygen, history of chronic diastolic dysfunction CHF and hypertension who presents to the ER via EMS in respiratory distress on a nonrebreather mask and required BiPAP to reduce work of breathing and improve oxygenation.  He received 40 mg of IV Lasix, IV Solu-Medrol and 3 DuoNeb's. Admission has been requested for acute on chronic respiratory failure as well as acute exacerbation of chronic diastolic dysfunction CHF.    Review of Systems: As per HPI otherwise all other systems reviewed and negative.    Past Medical History:  Diagnosis Date   Atrial fibrillation (HCC)    COPD (chronic obstructive pulmonary disease) (HCC)    Hypercholesteremia    Hypertension     Past Surgical History:  Procedure Laterality Date   CHOLECYSTECTOMY     REPLACEMENT TOTAL KNEE Bilateral      reports that he has never smoked. He has never used smokeless tobacco. He reports previous drug use. He reports that he does not drink alcohol.  Allergies  Allergen Reactions   Codeine Other (See Comments)   Morphine And Related Other (See Comments)    Family History  Problem Relation Age of Onset   Hypertension Mother       Prior to Admission medications   Medication Sig Start Date End Date Taking? Authorizing Provider  amLODipine (NORVASC) 5 MG tablet Take 5 mg by mouth daily.    [provider]  aspirin 325 MG tablet Take 325 mg by  mouth daily.    [provider]  atorvastatin (LIPITOR) 10 MG tablet Take 10 mg by mouth daily.    [provider]  Butalbital-APAP-Caffeine [id number] MG capsule Take 1 capsule by mouth daily as needed for headache. 08/17/19   [provider]  Cholecalciferol 25 MCG (1000 UT) capsule Take 1,000 Units by mouth daily.    [provider]  citalopram (CELEXA) 10 MG tablet Take 10 mg by mouth daily.     [provider]  fexofenadine (ALLEGRA) 180 MG tablet Take 60 mg by mouth daily.    [provider]  finasteride (PROSCAR) 5 MG tablet Take 5 mg by mouth daily.    [provider]  fluticasone (FLONASE) 50 MCG/ACT nasal spray Place 1 spray into both nostrils daily.    [provider]  furosemide (LASIX) 20 MG tablet Take 1 tablet (20 mg total) by mouth daily. 11/14/20   Enedina Finner, MD  gabapentin (NEURONTIN) 100 MG capsule Take 100 mg by mouth 2 (two) times daily. 09/12/20   [provider]  Glucosamine Sulfate 1000 MG CAPS Take 1 tablet by mouth 2 (two) times daily.    [provider]  metoprolol tartrate (LOPRESSOR) 50 MG tablet Take 50 mg by mouth 2 (two) times daily. 08/17/20   [provider]  mirtazapine (REMERON) 15 MG tablet Take 15 mg by mouth at bedtime. 11/02/20   [provider]  Multiple Vitamin (MULTI-VITAMIN) tablet Take 1 tablet by mouth daily.    [provider]  Omega-3 Fatty Acids (FISH OIL) 1000 MG CPDR Take 1 capsule by mouth in the morning and at bedtime.    [provider]  psyllium (METAMUCIL) 58.6 % packet Take 1 packet by mouth daily.    [provider]    Physical Exam: Vitals:   01/11/21 0632 01/11/21 0700 01/11/21 0730 01/11/21 0815  BP:  (!) 182/83 126/70 107/66  Pulse: 84 78 70 68  Resp:  (!) 27 (!) 22 16  Temp:      TempSrc:      SpO2: 100% 98% 100% 97%  Weight:      Height:         Vitals:   01/11/21 0632 01/11/21 0700 01/11/21 0730 01/11/21 0815  BP:  (!) 182/83 126/70 107/66  Pulse: 84 78 70 68  Resp:  (!) 27 (!) 22 16  Temp:      TempSrc:      SpO2: 100% 98% 100% 97%  Weight:      Height:          Constitutional: Alert and oriented x 3 . Not in any apparent distress.  Appears comfortable on BiPAP HEENT:      Head: Normocephalic and atraumatic.         Eyes: PERLA, EOMI, Conjunctivae are normal. Sclera is non-icteric.       Mouth/Throat:  Mucous membranes are moist.       Neck: Supple with no signs of meningismus. Cardiovascular: Irregularly irregular. No murmurs, gallops, or rubs. 2+ symmetrical distal pulses are present . No JVD. No 1+LE edema Respiratory: Respiratory effort normal . bilateral air entry in both lung fields. Crackles at the bases (Lt > Rt)  no rhonchi.  Gastrointestinal: Soft, non tender, and non distended with positive bowel sounds.  Genitourinary: No CVA tenderness. Musculoskeletal: Nontender with normal range of motion in all extremities. No cyanosis, or erythema of extremities. Neurologic:  Face is symmetric. Moving all extremities. No gross focal neurologic  deficits . Skin: Skin is warm, dry.  No rash or ulcers Psychiatric: Mood and affect are normal    Labs on Admission: I have personally reviewed following labs and imaging studies  CBC: Recent Labs  Lab 01/11/21 0627  WBC 11.4*  NEUTROABS 5.6  HGB 14.3  HCT 44.5  MCV 96.7  PLT 208   Basic Metabolic Panel: Recent Labs  Lab 01/11/21 0627  NA 138  K 3.5  CL 92*  CO2 34*  GLUCOSE 132*  BUN 10  CREATININE 0.94  CALCIUM 9.0   GFR: Estimated Creatinine Clearance: 62.9 mL/min (by C-G formula based on SCr of 0.94 mg/dL). Liver Function Tests: Recent Labs  Lab 01/11/21 0627  AST 21  ALT 10  ALKPHOS 69  BILITOT 1.0  PROT 7.5  ALBUMIN 3.8   No results for input(s): LIPASE, AMYLASE in the last 168 hours. No results for input(s): AMMONIA in the last 168 hours. Coagulation Profile: No results for input(s): INR, PROTIME in the last 168 hours. Cardiac Enzymes: No results for input(s): CKTOTAL, CKMB, CKMBINDEX, TROPONINI in the last 168 hours. BNP (last 3 results) No results for input(s): PROBNP in the last 8760 hours. HbA1C: No results for input(s): HGBA1C in the last 72 hours. CBG: No results for input(s): GLUCAP in the last 168 hours. Lipid Profile: No results for input(s): CHOL, HDL, LDLCALC, TRIG, CHOLHDL, LDLDIRECT in the  last 72 hours. Thyroid Function Tests: No results for input(s): TSH, T4TOTAL, FREET4, T3FREE, THYROIDAB in the last 72 hours. Anemia Panel: No results for input(s): VITAMINB12, FOLATE, FERRITIN, TIBC, IRON, RETICCTPCT in the last 72 hours. Urine analysis:    Component Value Date/Time   COLORURINE YELLOW (A) 01/17/2016 1945   APPEARANCEUR CLEAR (A) 01/17/2016 1945   LABSPEC 1.009 01/17/2016 1945   PHURINE 6.0 01/17/2016 1945   GLUCOSEU NEGATIVE 01/17/2016 1945   HGBUR NEGATIVE 01/17/2016 1945   BILIRUBINUR NEGATIVE 01/17/2016 1945   KETONESUR NEGATIVE 01/17/2016 1945   PROTEINUR NEGATIVE 01/17/2016 1945   NITRITE NEGATIVE 01/17/2016 1945   LEUKOCYTESUR NEGATIVE 01/17/2016 1945    Radiological Exams on Admission: DG Chest 1 View  Result Date: 01/11/2021 CLINICAL DATA:  Shortness of breath. diff breathing; bleaky; 15L non rebreaher; rhonchi EXAM: CHEST  1 VIEW COMPARISON:  Chest x-ray 11/10/2020. FINDINGS: Persistent enlarged cardiac silhouette. The heart size and mediastinal contours are otherwise unchanged. Aortic calcification. Interval development of bilateral nodular lesions:a 6 mm right mid lung zone and a 6mm left mid lung zone noted. Increased interstitial markings. Small to moderate size left pleural effusion. No pneumothorax. No acute osseous abnormality. IMPRESSION: 1. Increased interstitial markings as well as bilateral pulmonary nodules. 2. Small to moderate volume left pleural effusion. 3. Cardiomegaly. 4. Recommend CT chest with intravenous contrast for further evaluation. Electronically Signed   By: Tish FredericksonMorgane  Naveau M.D.   On: 01/11/2021 06:49     Assessment/Plan Principal Problem:   Acute respiratory failure (HCC) Active Problems:   Acute CHF (congestive heart failure) (HCC)   Atrial fibrillation (HCC)   Depression   Obesity, morbid, BMI 40.0-49.9 (HCC)   Chronic respiratory failure (HCC)     Acute on chronic respiratory failure Secondary to acute diastolic  dysfunction CHF Patient has a history of chronic respiratory failure and at baseline wears 3 L of oxygen. He presented to the ER in respiratory distress and was said to have room air pulse oximetry in the mid 80s.  He was placed on BiPAP to reduce work of breathing and improve oxygenation. Will  attempt to wean patient off BiPAP once his acute respiratory distress improves and place him back on his scheduled home oxygen therapy    Acute on chronic diastolic dysfunction CHF Most likely flash pulmonary edema from hypertensive urgency. Patient had a blood pressure of 184/122 upon arrival to the emergency room Optimize blood pressure control, continue metoprolol and amlodipine Place patient on Lasix 40 mg IV daily Maintain low-sodium diet Obtain 2D echocardiogram to assess LVEF.  Last 2D echocardiogram was from 2017 and last known LVEF was 55%      Chronic atrial fibrillation Rate controlled on metoprolol Not on long-term anticoagulation due to increased fall risk Continue aspirin 325 mg daily     Depression  Continue mirtazapine and citalopram    Morbid obesity (BMI 42.2 kg/m2) Complicates overall prognosis and care    COPD with chronic respiratory failure Not acutely exacerbated Continue as needed bronchodilator therapy     DVT prophylaxis: Lovenox  Code Status: DO NOT RESUSCITATE Family Communication: Greater than 50% of time was spent discussing patient's condition and plan of care with him at the bedside.  All questions and concerns have been addressed.  He verbalizes understanding and agrees with the plan.  CODE STATUS was discussed and he is a DNR. Disposition Plan: Back to previous home environment Consults called: Cardiology Status: At the time of admission, it appears that the appropriate admission status for this patient is inpatient. This is judged to be reasonable and necessary in order to provide the required intensity of service to ensure the patient's  safety given the presenting symptoms, physical exam findings, and initial radiographic and laboratory data in the context of the comorbid conditions. Patient requires inpatient status due to high intensity of service, high risk for further deterioration and high frequency of surveillance required.    Lucile Shutters MD [facility]     01/11/2021, 8:53 AM

## 2021-01-11 NOTE — ED Notes (Signed)
Lab at bedside

## 2021-01-12 ENCOUNTER — Inpatient Hospital Stay: Payer: Medicare Other

## 2021-01-12 DIAGNOSIS — I4821 Permanent atrial fibrillation: Secondary | ICD-10-CM

## 2021-01-12 DIAGNOSIS — J9600 Acute respiratory failure, unspecified whether with hypoxia or hypercapnia: Secondary | ICD-10-CM

## 2021-01-12 DIAGNOSIS — L8990 Pressure ulcer of unspecified site, unspecified stage: Secondary | ICD-10-CM | POA: Insufficient documentation

## 2021-01-12 DIAGNOSIS — I5033 Acute on chronic diastolic (congestive) heart failure: Secondary | ICD-10-CM

## 2021-01-12 DIAGNOSIS — I5031 Acute diastolic (congestive) heart failure: Secondary | ICD-10-CM

## 2021-01-12 LAB — PROTEIN, PLEURAL OR PERITONEAL FLUID: Total protein, fluid: <3 g/dL

## 2021-01-12 LAB — BASIC METABOLIC PANEL
Anion gap: 8 (ref 5–15)
BUN: 13 mg/dL (ref 8–23)
CO2: 34 mmol/L — ABNORMAL HIGH (ref 22–32)
Calcium: 8.8 mg/dL — ABNORMAL LOW (ref 8.9–10.3)
Chloride: 93 mmol/L — ABNORMAL LOW (ref 98–111)
Creatinine, Ser: 0.89 mg/dL (ref 0.61–1.24)
GFR, Estimated: >60 mL/min (ref >60)
Glucose, Bld: 111 mg/dL — ABNORMAL HIGH (ref 70–99)
Potassium: 5 mmol/L (ref 3.5–5.1)
Sodium: 135 mmol/L (ref 135–145)

## 2021-01-12 LAB — BODY FLUID CELL COUNT WITH DIFFERENTIAL
Eos, Fluid: 0 %
Lymphs, Fluid: 88 %
Monocyte-Macrophage-Serous Fluid: 5 %
Neutrophil Count, Fluid: 7 %
Total Nucleated Cell Count, Fluid: 953 cu mm

## 2021-01-12 LAB — GLUCOSE, PLEURAL OR PERITONEAL FLUID: Glucose, Fluid: 122 mg/dL

## 2021-01-12 LAB — ALBUMIN, PLEURAL OR PERITONEAL FLUID: Albumin, Fluid: 1.4 g/dL

## 2021-01-12 LAB — LACTATE DEHYDROGENASE, PLEURAL OR PERITONEAL FLUID: LD, Fluid: 60 U/L — ABNORMAL HIGH (ref 3–23)

## 2021-01-12 LAB — AMYLASE, PLEURAL OR PERITONEAL FLUID: Amylase, Fluid: 28 U/L

## 2021-01-12 MED ORDER — POTASSIUM CHLORIDE 20 MEQ PO PACK
40.0000 meq | PACK | Freq: Once | ORAL | Status: AC
  Administered 2021-01-12: 40 meq via ORAL
  Filled 2021-01-12: qty 2

## 2021-01-12 MED ORDER — ENSURE ENLIVE PO LIQD
237.0000 mL | Freq: Two times a day (BID) | ORAL | Status: DC
  Administered 2021-01-13: 237 mL via ORAL

## 2021-01-12 MED ORDER — ASCORBIC ACID 500 MG PO TABS
250.0000 mg | ORAL_TABLET | Freq: Two times a day (BID) | ORAL | Status: DC
  Administered 2021-01-12 – 2021-01-14 (×4): 250 mg via ORAL
  Filled 2021-01-12 (×4): qty 1

## 2021-01-12 MED ORDER — IRBESARTAN 150 MG PO TABS
150.0000 mg | ORAL_TABLET | Freq: Every day | ORAL | Status: DC
  Administered 2021-01-12: 150 mg via ORAL
  Filled 2021-01-12: qty 1

## 2021-01-12 NOTE — Progress Notes (Signed)
Initial Nutrition Assessment  DOCUMENTATION CODES:   Obesity unspecified  INTERVENTION:   Ensure Enlive po BID, each supplement provides 350 kcal and 20 grams of protein  MVI po daily   Vitamin C 250mg  po BID   NUTRITION DIAGNOSIS:   Increased nutrient needs related to chronic illness (COPD, CHF, wound healing) as evidenced by estimated needs.  GOAL:   Patient will meet greater than or equal to 90% of their needs  MONITOR:   PO intake, Supplement acceptance, Labs, Weight trends, Skin, I & O's  REASON FOR ASSESSMENT:   Malnutrition Screening Tool    ASSESSMENT:   87 y.o. male with a medical history significant for atrial fibrillation not on anticoagulation, COPD with chronic respiratory failure on 3 L of oxygen, chronic diastolic dysfunction CHF with last known LVEF of 55%, hypertension, depression, CKD III and dyslipidemia who was brought into the ER by EMS for evaluation of respiratory distress.  RD working remotely.  Unable to reach pt by phone. RD suspects pt with good appetite and oral intake at baseline as pt appears weight stable pta. Pt documented to have eaten sips/bites of his breakfast and 70% of his lunch. RD will add supplements to help pt meet his estimated needs. RD will also add vitamins to support wound healing. RD will obtain nutrition related history and exam at follow-up.   Medications reviewed and include: aspirin, D3, celexa, lovenox, lasix, remeron, MVI, lovaza, psyllium  Labs reviewed: K 5.0 wnl Wbc- 11.4(H)  NUTRITION - FOCUSED PHYSICAL EXAM: Unable to perform at this time   Diet Order:   Diet Order             Diet 2 gram sodium Room service appropriate? Yes; Fluid consistency: Thin  Diet effective now                  EDUCATION NEEDS:   No education needs have been identified at this time  Skin:  Skin Assessment: Reviewed RN Assessment (Stage III L hip)  Last BM:  6/8- type 5  Height:   Ht Readings from Last 1 Encounters:   01/11/21 5\' 7"  (1.702 m)    Weight:   Wt Readings from Last 1 Encounters:  01/12/21 92.6 kg    Ideal Body Weight:  67 kg  BMI:  Body mass index is 31.98 kg/m.  Estimated Nutritional Needs:   Kcal:  1900-2200kcal/day  Protein:  95-110g/day  Fluid:  1.7-2.0L/day  MS, RD, LDN Please refer to [facility] for RD and/or RD on-call/weekend/after hours pager

## 2021-01-12 NOTE — TOC Initial Note (Signed)
Transition of Care [facility]) - Initial/Assessment Note    Patient Details  Name: Brian Lara MRN: [id number] Date of Birth: 04-05-1926  [facility] CM/SW Contact:    Alberteen Sam, LCSW Phone Number: 01/12/2021, 10:22 AM  Clinical Narrative:                  CSW consulted for heart failure home health screen. CSW met with patient at bedside along with his son Brian Lara who patient identifies as his "spokesperson". They report they are not interested in home health services at this time, including any RN, PT or OT. They report they believe patient will be more independent at discharge after getting his fluid off and feeling better, they reports being hopeful he will get stronger after hospital stay. Currently patient has no DME needs, reports they have a walker and cane at home and a shower chair. Patient currently in on 2L of oxygen at home through [employer]. Patient reports son Brian Lara will transport home at time of discharge. Patient identifies having PCP and no problems getting to and from appointments or obtaining medications.   No further discharge needs identified at this time.   Expected Discharge Plan: Home/Self Care Barriers to Discharge: Continued Medical Work up   Patient Goals and CMS Choice Patient states their goals for this hospitalization and ongoing recovery are:: to go home CMS Medicare.gov Compare Post Acute Care list provided to:: Patient Choice offered to / list presented to : Patient  Expected Discharge Plan and Services Expected Discharge Plan: Home/Self Care       Living arrangements for the past 2 months: Single Family Home                 DME Arranged: Oxygen DME Agency: [employer]                  Prior Living Arrangements/Services Living arrangements for the past 2 months: Single Family Home Lives with:: Adult Children                   Activities of Daily Living Home Assistive Devices/Equipment: Cane (specify quad or straight),  Oxygen, Walker (specify type) ADL Screening (condition at time of admission) Patient's cognitive ability adequate to safely complete daily activities?: Yes Is the patient deaf or have difficulty hearing?: Yes Does the patient have difficulty seeing, even when wearing glasses/contacts?: No Does the patient have difficulty concentrating, remembering, or making decisions?: No Patient able to express need for assistance with ADLs?: Yes Does the patient have difficulty dressing or bathing?: No Independently performs ADLs?: Yes (appropriate for developmental age) Does the patient have difficulty walking or climbing stairs?: Yes Weakness of Legs: Both Weakness of Arms/Hands: None  Permission Sought/Granted                  Emotional Assessment Appearance:: Appears stated age Attitude/Demeanor/Rapport: Gracious Affect (typically observed): Calm Orientation: : Oriented to Self, Oriented to Place, Oriented to  Time, Oriented to Situation Alcohol / Substance Use: Not Applicable Psych Involvement: No (comment)  Admission diagnosis:  Acute respiratory failure (HCC) [J96.00] Acute respiratory failure with hypoxia (HCC) [J96.01] Acute on chronic diastolic CHF (congestive heart failure) ([place]) [I50.33] Acute on chronic congestive heart failure, unspecified heart failure type ([place]) [I50.9] Patient Active Problem List   Diagnosis Date Noted   Acute respiratory failure ([place]) 01/11/2021   Obesity, morbid, BMI 40.0-49.9 ([place]) 01/11/2021   Chronic respiratory failure ([place]) 01/11/2021   Acute on chronic diastolic CHF (  congestive heart failure) ([place]) 01/11/2021   Acute CHF (congestive heart failure) ([place]) 11/10/2020   COPD exacerbation ([place]) 11/10/2020   Hypertension    Hypercholesteremia    Atrial fibrillation ([place])    Acute respiratory failure with hypoxia ([place])    Depression    CKD (chronic kidney disease), stage IIIa    PCP:  Leonel Ramsay, MD Pharmacy:   [facility]  [place], [address] [place] [address] Phone: [phone] Fax: [phone]  [id number] [place]Phone: [phone]: [phone]    Social Determinants of Health (SDOH) Interventions    Readmission Risk Interventions No flowsheet data found.

## 2021-01-12 NOTE — Progress Notes (Signed)
OT Cancellation Note  Patient Details Name: Brian Lara MRN: [id number] DOB: [birth year]   Cancelled Treatment:    Reason Eval/Treat Not Completed: Patient at procedure or test/ unavailable Pt is OTF to ultrasound at this time. Will f/u for OT evaluation at later date/time as able. Thank you.  Rejeana Brock, MS, OTR/L ascom [phone] 01/12/21, 4:37 PM

## 2021-01-12 NOTE — Progress Notes (Signed)
[facility]  Patient: Brian SellerJohn A Lara / Admit Date: 01/11/2021 / Date of Encounter: 01/12/2021, 7:46 AM   Subjective: Patient overall slightly improved since admission with less shortness of breath cough or congestion.  Patient has had reasonable urine output with a total output of 760mL with current treatment.  There is chronic shortness of breath requiring oxygenation with 2 L of oxygen.  Patient has been on appropriate medication management for acute on chronic diastolic dysfunction heart failure including beta-blocker but will need further medication management for hypertension control as well.  Review of Systems: Positive for: Shortness of breath Negative for: Vision change, hearing change, syncope, dizziness, nausea, vomiting,diarrhea, bloody stool, stomach pain, cough, congestion, diaphoresis, urinary frequency, urinary pain,skin lesions, skin rashes Others previously listed  Objective: Telemetry: Atrial fibrillation with controlled ventricular rate Physical Exam: Blood pressure (!) 168/81, pulse 65, temperature 98.7 F (37.1 C), temperature source Oral, resp. rate (!) 24, height 5\' 7"  (1.702 m), weight 92.6 kg, SpO2 96 %. Body mass index is 31.98 kg/m. General: Well developed, well nourished, in no acute distress. Head: Normocephalic, atraumatic, sclera non-icteric, no xanthomas, nares are without discharge. Neck: No apparent masses Lungs: Normal respirations with few wheezes, no rhonchi, basilar rales , no crackles   Heart: Irregular few rate and rhythm, normal S1 S2, no murmur, no rub, no gallop, PMI is normal size and placement, carotid upstroke normal without bruit, jugular venous pressure normal Abdomen: Soft, non-tender, non-distended with normoactive bowel sounds. No hepatosplenomegaly. Abdominal aorta is normal size without bruit Extremities: EXTR 1+ edema, no clubbing, no cyanosis, no ulcers,  Peripheral: 2+ radial, 2+ femoral, 2+ dorsal  pedal pulses Neuro: Alert and oriented. Moves all extremities spontaneously. Psych:  Responds to questions appropriately with a normal affect.   Intake/Output Summary (Last 24 hours) at 01/12/2021 0746 Last data filed at 01/12/2021 0514 Gross per 24 hour  Intake 240 ml  Output 1000 ml  Net -760 ml    Inpatient Medications:   amLODipine  5 mg Oral Daily   aspirin EC  325 mg Oral Daily   atorvastatin  10 mg Oral QHS   cholecalciferol  1,000 Units Oral Daily   citalopram  10 mg Oral Daily   enoxaparin (LOVENOX) injection  60 mg Subcutaneous Q24H   finasteride  5 mg Oral Daily   furosemide  40 mg Intravenous Daily   gabapentin  100 mg Oral BID   loratadine  10 mg Oral Daily   metoprolol tartrate  50 mg Oral BID   mirtazapine  15 mg Oral QHS   multivitamin with minerals  1 tablet Oral Daily   omega-3 acid ethyl esters  1 g Oral BID   psyllium  1 packet Oral Daily   sodium chloride flush  3 mL Intravenous Q12H   Infusions:   sodium chloride      Labs: Recent Labs    01/11/21 0627 01/12/21 0708  NA 138 135  K 3.5 5.0  CL 92* 93*  CO2 34* 34*  GLUCOSE 132* 111*  BUN 10 13  CREATININE 0.94 0.89  CALCIUM 9.0 8.8*   Recent Labs    01/11/21 0627  AST 21  ALT 10  ALKPHOS 69  BILITOT 1.0  PROT 7.5  ALBUMIN 3.8   Recent Labs    01/11/21 0627  WBC 11.4*  NEUTROABS 5.6  HGB 14.3  HCT 44.5  MCV 96.7  PLT 208   No results for input(s): CKTOTAL, CKMB, TROPONINI in the  last 72 hours. Invalid input(s): POCBNP No results for input(s): HGBA1C in the last 72 hours.   Weights: Filed Weights   01/11/21 0627 01/12/21 0500  Weight: 122.5 kg 92.6 kg     Radiology/Studies:  DG Chest 1 View  Result Date: 01/11/2021 CLINICAL DATA:  Shortness of breath. diff breathing; bleaky; 15L non rebreaher; rhonchi EXAM: CHEST  1 VIEW COMPARISON:  Chest x-ray 11/10/2020. FINDINGS: Persistent enlarged cardiac silhouette. The heart size and mediastinal contours are otherwise  unchanged. Aortic calcification. Interval development of bilateral nodular lesions:a 6 mm right mid lung zone and a 80mm left mid lung zone noted. Increased interstitial markings. Small to moderate size left pleural effusion. No pneumothorax. No acute osseous abnormality. IMPRESSION: 1. Increased interstitial markings as well as bilateral pulmonary nodules. 2. Small to moderate volume left pleural effusion. 3. Cardiomegaly. 4. Recommend CT chest with intravenous contrast for further evaluation. Electronically Signed   By: Tish Frederickson M.D.   On: 01/11/2021 06:49   CT Angio Chest PE W and/or Wo Contrast  Result Date: 01/11/2021 CLINICAL DATA:  Shortness of breath, cough. EXAM: CT ANGIOGRAPHY CHEST WITH CONTRAST TECHNIQUE: Multidetector CT imaging of the chest was performed using the standard protocol during bolus administration of intravenous contrast. Multiplanar CT image reconstructions and MIPs were obtained to evaluate the vascular anatomy. CONTRAST:  13mL OMNIPAQUE IOHEXOL 350 MG/ML SOLN COMPARISON:  None. FINDINGS: Cardiovascular: No filling defects in the pulmonary arteries to suggest pulmonary emboli. Cardiomegaly. Diffuse coronary artery calcifications and aortic calcifications throughout the aortic arch and descending thoracic aorta. No aneurysm. Mediastinum/Nodes: No mediastinal, hilar, or axillary adenopathy. Trachea and esophagus are unremarkable. Enlarged left thyroid lobe which extends into the substernal region. Lungs/Pleura: Bilateral moderate to large pleural effusions, left greater than right. Compressive atelectasis in the lower lobes and in the lingula. Upper Abdomen: Imaging into the upper abdomen demonstrates no acute findings. Musculoskeletal: Chest wall soft tissues are unremarkable. No acute bony abnormality. Review of the MIP images confirms the above findings. IMPRESSION: No evidence of pulmonary embolus. Cardiomegaly, diffuse coronary artery disease. Moderate to large bilateral  pleural effusions with compressive atelectasis, left greater than right. Enlarged left thyroid lobe with substernal extension. In the setting of significant comorbidities or limited life expectancy, no follow-up recommended (ref: J Am Coll Radiol. 2015 Feb;12(2): 143-50). Aortic Atherosclerosis (ICD10-I70.0). Electronically Signed   By: Charlett Nose M.D.   On: 01/11/2021 10:33   ECHOCARDIOGRAM COMPLETE  Result Date: 01/11/2021    ECHOCARDIOGRAM REPORT   Patient Name:   Brian Lara Date of Exam: 01/11/2021 Medical Rec #:  [id number]        Height:       67.0 in Accession #:    [id number]       Weight:       270.0 lb Date of Birth:  Mar 15, 1926        BSA:          2.297 m Patient Age:    87 years         BP:           122/72 mmHg Patient Gender: M                HR:           67 bpm. Exam Location:  [facility] Procedure: 2D Echo, Cardiac Doppler and Color Doppler Indications:     CHF-acute diastolic I50.31  History:         Patient has no prior history of  Echocardiogram examinations.                  COPD, Arrythmias:Atrial Fibrillation; Risk                  Factors:Hypertension.  Sonographer:     Cristela Blue RDCS (AE) Referring Phys:  [id number] Lucile Shutters Diagnosing Phys: Arnoldo Hooker MD  Sonographer Comments: Suboptimal apical window. IMPRESSIONS  1. Left ventricular ejection fraction, by estimation, is 50 to 55%. The left ventricle has low normal function. The left ventricle has no regional wall motion abnormalities. Left ventricular diastolic parameters were normal.  2. Right ventricular systolic function is normal. The right ventricular size is normal.  3. Left atrial size was mildly dilated.  4. The mitral valve is normal in structure. Mild to moderate mitral valve regurgitation.  5. The aortic valve is normal in structure. Aortic valve regurgitation is trivial. FINDINGS  Left Ventricle: Left ventricular ejection fraction, by estimation, is 50 to 55%. The left ventricle has low normal function. The left  ventricle has no regional wall motion abnormalities. The left ventricular internal cavity size was normal in size. There is no left ventricular hypertrophy. Left ventricular diastolic parameters were normal. Right Ventricle: The right ventricular size is normal. No increase in right ventricular wall thickness. Right ventricular systolic function is normal. Left Atrium: Left atrial size was mildly dilated. Right Atrium: Right atrial size was normal in size. Pericardium: There is no evidence of pericardial effusion. Mitral Valve: The mitral valve is normal in structure. Mild to moderate mitral valve regurgitation. Tricuspid Valve: The tricuspid valve is normal in structure. Tricuspid valve regurgitation is mild. Aortic Valve: The aortic valve is normal in structure. Aortic valve regurgitation is trivial. Aortic valve mean gradient measures 6.0 mmHg. Aortic valve peak gradient measures 11.1 mmHg. Aortic valve area, by VTI measures 1.80 cm. Pulmonic Valve: The pulmonic valve was normal in structure. Pulmonic valve regurgitation is trivial. Aorta: The aortic root and ascending aorta are structurally normal, with no evidence of dilitation. IAS/Shunts: No atrial level shunt detected by color flow Doppler.  LEFT VENTRICLE PLAX 2D LVIDd:         4.28 cm  Diastology LVIDs:         3.10 cm  LV e' medial:    6.96 cm/s LV PW:         1.86 cm  LV E/e' medial:  14.5 LV IVS:        1.58 cm  LV e' lateral:   8.16 cm/s LVOT diam:     2.00 cm  LV E/e' lateral: 12.4 LV SV:         58 LV SV Index:   25 LVOT Area:     3.14 cm  RIGHT VENTRICLE RV Basal diam:  3.32 cm LEFT ATRIUM            Index       RIGHT ATRIUM           Index LA diam:      5.40 cm  2.35 cm/m  RA Area:     33.80 cm LA Vol (A4C): 126.0 ml 54.85 ml/m RA Volume:   114.00 ml 49.62 ml/m  AORTIC VALVE                    PULMONIC VALVE AV Area (Vmax):    1.73 cm     PV Vmax:        0.78 m/s AV Area (Vmean):  1.74 cm     PV Peak grad:   2.4 mmHg AV Area (VTI):      1.80 cm     RVOT Peak grad: 4 mmHg AV Vmax:           166.33 cm/s AV Vmean:          113.667 cm/s AV VTI:            0.323 m AV Peak Grad:      11.1 mmHg AV Mean Grad:      6.0 mmHg LVOT Vmax:         91.40 cm/s LVOT Vmean:        63.000 cm/s LVOT VTI:          0.185 m LVOT/AV VTI ratio: 0.57  AORTA Ao Root diam: 3.10 cm MITRAL VALVE                TRICUSPID VALVE MV Area (PHT): 6.65 cm     TR Peak grad:   25.0 mmHg MV Decel Time: 114 msec     TR Vmax:        250.00 cm/s MV E velocity: 101.00 cm/s MV A velocity: 54.80 cm/s   SHUNTS MV E/A ratio:  1.84         Systemic VTI:  0.18 m                             Systemic Diam: 2.00 cm Arnoldo Hooker MD Electronically signed by Arnoldo Hooker MD Signature Date/Time: 01/11/2021/5:19:32 PM    Final      Assessment and Recommendation  87 y.o. male with acute on chronic diastolic dysfunction congestive heart failure with hypertension hyperlipidemia and severe COPD with oxygen requirement and hypertension without evidence of myocardial infarction and/or acute coronary syndrome 1.  Continue intravenous diuresis with Lasix at current dosage for further better urine output and acute on chronic diastolic dysfunction congestive heart failure 2.  Continue hypertension control with amlodipine and addition of angiotensin receptor blocker with a goal systolic blood pressure below 062 mm 3.  Continue oxygenation for chronic obstructive pulmonary disease and hypoxia 4.  Metoprolol for heart rate control of atrial fibrillation without change today 5.  No use of anticoagulation at this time due to concerns of fall risk and patient wishes at this time 6.  No further cardiac diagnostics necessary at this time  Signed, Arnoldo Hooker M.D. FACC

## 2021-01-12 NOTE — Progress Notes (Signed)
PROGRESS NOTE    Brian SellerJohn A Reich  ZOX:[id number] DOB: 1926-05-17 DOA: 01/11/2021 PCP: Mick SellFitzgerald, David P, MD    Brief Narrative:  Brian Lara is a 87 y.o. male with medical history significant for atrial fibrillation not on anticoagulation, COPD with chronic respiratory failure on 3 L of oxygen, history of chronic diastolic dysfunction CHF with last known LVEF of 55%, hypertension and dyslipidemia who was brought into the ER by EMS for evaluation of respiratory distress. Patient states he went to bed in his usual state of health and woke up in the early hours of the morning with difficulty breathing.  He had his oxygen on at 3 L but felt he was not getting enough air.  Per EMS room air pulse oximetry was in the upper 80s.  He has some chest discomfort over the left anterior chest wall but denied having any chest pain.  He has lower extremity swelling.    Consultants:  cardiology  Procedures:   Antimicrobials:      Subjective: Still with sob. No cp this am. No n/v/abd pain.   Objective: Vitals:   01/11/21 2203 01/12/21 0013 01/12/21 0500 01/12/21 0758  BP: (!) 162/83 (!) 168/81  135/74  Pulse: 60 65  (!) 54  Resp: (!) 27 (!) 24  18  Temp: 98.3 F (36.8 C) 98.7 F (37.1 C)  98.5 F (36.9 C)  TempSrc: Oral Oral  Oral  SpO2: 96% 96%  95%  Weight:   92.6 kg   Height:        Intake/Output Summary (Last 24 hours) at 01/12/2021 0819 Last data filed at 01/12/2021 0514 Gross per 24 hour  Intake 240 ml  Output 1000 ml  Net -760 ml   Filed Weights   01/11/21 0627 01/12/21 0500  Weight: 122.5 kg 92.6 kg    Examination:  General exam: Appears calm and comfortable  Respiratory system: Decreased breath sounds at bases, increased expiratory time decreased air exchange no wheezing or rails Cardiovascular system: S1 & S2 heard, RRR. No JVD, murmurs, rubs, gallops or clicks.  Gastrointestinal system: Abdomen is nondistended, soft and nontender.  Normal bowel sounds  heard. Central nervous system: Alert and oriented. grossly intact Extremities: Mild bilateral edema Skin: Warm dry Psychiatry: Mood & affect appropriate.     Data Reviewed: I have personally reviewed following labs and imaging studies  CBC: Recent Labs  Lab 01/11/21 0627  WBC 11.4*  NEUTROABS 5.6  HGB 14.3  HCT 44.5  MCV 96.7  PLT 208   Basic Metabolic Panel: Recent Labs  Lab 01/11/21 0627 01/12/21 0708  NA 138 135  K 3.5 5.0  CL 92* 93*  CO2 34* 34*  GLUCOSE 132* 111*  BUN 10 13  CREATININE 0.94 0.89  CALCIUM 9.0 8.8*   GFR: Estimated Creatinine Clearance: 57.5 mL/min (by C-G formula based on SCr of 0.89 mg/dL). Liver Function Tests: Recent Labs  Lab 01/11/21 0627  AST 21  ALT 10  ALKPHOS 69  BILITOT 1.0  PROT 7.5  ALBUMIN 3.8   No results for input(s): LIPASE, AMYLASE in the last 168 hours. No results for input(s): AMMONIA in the last 168 hours. Coagulation Profile: No results for input(s): INR, PROTIME in the last 168 hours. Cardiac Enzymes: No results for input(s): CKTOTAL, CKMB, CKMBINDEX, TROPONINI in the last 168 hours. BNP (last 3 results) No results for input(s): PROBNP in the last 8760 hours. HbA1C: No results for input(s): HGBA1C in the last 72 hours. CBG: No results for  input(s): GLUCAP in the last 168 hours. Lipid Profile: No results for input(s): CHOL, HDL, LDLCALC, TRIG, CHOLHDL, LDLDIRECT in the last 72 hours. Thyroid Function Tests: No results for input(s): TSH, T4TOTAL, FREET4, T3FREE, THYROIDAB in the last 72 hours. Anemia Panel: No results for input(s): VITAMINB12, FOLATE, FERRITIN, TIBC, IRON, RETICCTPCT in the last 72 hours. Sepsis Labs: Recent Labs  Lab 01/11/21 1610  PROCALCITON <0.10    Recent Results (from the past 240 hour(s))  Resp Panel by RT-PCR (Flu A&B, Covid) Nasopharyngeal Swab     Status: None   Collection Time: 01/11/21  6:27 AM   Specimen: Nasopharyngeal Swab; Nasopharyngeal(NP) swabs in vial transport  medium  Result Value Ref Range Status   SARS Coronavirus 2 by RT PCR NEGATIVE NEGATIVE Final    Comment: (NOTE) SARS-CoV-2 target nucleic acids are NOT DETECTED.  The SARS-CoV-2 RNA is generally detectable in upper respiratory specimens during the acute phase of infection. The lowest concentration of SARS-CoV-2 viral copies this assay can detect is 138 copies/mL. A negative result does not preclude SARS-Cov-2 infection and should not be used as the sole basis for treatment or other patient management decisions. A negative result may occur with  improper specimen collection/handling, submission of specimen other than nasopharyngeal swab, presence of viral mutation(s) within the areas targeted by this assay, and inadequate number of viral copies(<138 copies/mL). A negative result must be combined with clinical observations, patient history, and epidemiological information. The expected result is Negative.  Fact Sheet for Patients:  BloggerCourse.com  Fact Sheet for Healthcare Providers:  SeriousBroker.it  This test is no t yet approved or cleared by the [place] FDA and  has been authorized for detection and/or diagnosis of SARS-CoV-2 by FDA under an Emergency Use Authorization (EUA). This EUA will remain  in effect (meaning this test can be used) for the duration of the COVID-19 declaration under Section 564(b)(1) of the Act, 21 U.S.C.section 360bbb-3(b)(1), unless the authorization is terminated  or revoked sooner.       Influenza A by PCR NEGATIVE NEGATIVE Final   Influenza B by PCR NEGATIVE NEGATIVE Final    Comment: (NOTE) The Xpert Xpress SARS-CoV-2/FLU/RSV plus assay is intended as an aid in the diagnosis of influenza from Nasopharyngeal swab specimens and should not be used as a sole basis for treatment. Nasal washings and aspirates are unacceptable for Xpert Xpress SARS-CoV-2/FLU/RSV testing.  Fact Sheet for  Patients: BloggerCourse.com  Fact Sheet for Healthcare Providers: SeriousBroker.it  This test is not yet approved or cleared by the [place] FDA and has been authorized for detection and/or diagnosis of SARS-CoV-2 by FDA under an Emergency Use Authorization (EUA). This EUA will remain in effect (meaning this test can be used) for the duration of the COVID-19 declaration under Section 564(b)(1) of the Act, 21 U.S.C. section 360bbb-3(b)(1), unless the authorization is terminated or revoked.  Performed at [facility], [address] [place], [place] [address]   Blood culture (single)     Status: None (Preliminary result)   Collection Time: 01/11/21  9:16 AM   Specimen: BLOOD  Result Value Ref Range Status   Specimen Description BLOOD BLOOD LEFT WRIST  Final   Special Requests   Final    BOTTLES DRAWN AEROBIC AND ANAEROBIC Blood Culture adequate volume   Culture   Final    NO GROWTH < 24 HOURS Performed at [facility], [address] [place], [place] [address]    Report Status PENDING  Incomplete  Radiology Studies: DG Chest 1 View  Result Date: 01/11/2021 CLINICAL DATA:  Shortness of breath. diff breathing; bleaky; 15L non rebreaher; rhonchi EXAM: CHEST  1 VIEW COMPARISON:  Chest x-ray 11/10/2020. FINDINGS: Persistent enlarged cardiac silhouette. The heart size and mediastinal contours are otherwise unchanged. Aortic calcification. Interval development of bilateral nodular lesions:a 6 mm right mid lung zone and a 74mm left mid lung zone noted. Increased interstitial markings. Small to moderate size left pleural effusion. No pneumothorax. No acute osseous abnormality. IMPRESSION: 1. Increased interstitial markings as well as bilateral pulmonary nodules. 2. Small to moderate volume left pleural effusion. 3. Cardiomegaly. 4. Recommend CT chest with intravenous contrast for further evaluation.  Electronically Signed   By: Tish Frederickson M.D.   On: 01/11/2021 06:49   CT Angio Chest PE W and/or Wo Contrast  Result Date: 01/11/2021 CLINICAL DATA:  Shortness of breath, cough. EXAM: CT ANGIOGRAPHY CHEST WITH CONTRAST TECHNIQUE: Multidetector CT imaging of the chest was performed using the standard protocol during bolus administration of intravenous contrast. Multiplanar CT image reconstructions and MIPs were obtained to evaluate the vascular anatomy. CONTRAST:  67mL OMNIPAQUE IOHEXOL 350 MG/ML SOLN COMPARISON:  None. FINDINGS: Cardiovascular: No filling defects in the pulmonary arteries to suggest pulmonary emboli. Cardiomegaly. Diffuse coronary artery calcifications and aortic calcifications throughout the aortic arch and descending thoracic aorta. No aneurysm. Mediastinum/Nodes: No mediastinal, hilar, or axillary adenopathy. Trachea and esophagus are unremarkable. Enlarged left thyroid lobe which extends into the substernal region. Lungs/Pleura: Bilateral moderate to large pleural effusions, left greater than right. Compressive atelectasis in the lower lobes and in the lingula. Upper Abdomen: Imaging into the upper abdomen demonstrates no acute findings. Musculoskeletal: Chest wall soft tissues are unremarkable. No acute bony abnormality. Review of the MIP images confirms the above findings. IMPRESSION: No evidence of pulmonary embolus. Cardiomegaly, diffuse coronary artery disease. Moderate to large bilateral pleural effusions with compressive atelectasis, left greater than right. Enlarged left thyroid lobe with substernal extension. In the setting of significant comorbidities or limited life expectancy, no follow-up recommended (ref: J Am Coll Radiol. 2015 Feb;12(2): 143-50). Aortic Atherosclerosis (ICD10-I70.0). Electronically Signed   By: Charlett Nose M.D.   On: 01/11/2021 10:33   ECHOCARDIOGRAM COMPLETE  Result Date: 01/11/2021    ECHOCARDIOGRAM REPORT   Patient Name:   EDU ON Vollrath Date of  Exam: 01/11/2021 Medical Rec #:  [id number]        Height:       67.0 in Accession #:    [id number]       Weight:       270.0 lb Date of Birth:  April 08, 1926        BSA:          2.297 m Patient Age:    87 years         BP:           122/72 mmHg Patient Gender: M                HR:           67 bpm. Exam Location:  [facility] Procedure: 2D Echo, Cardiac Doppler and Color Doppler Indications:     CHF-acute diastolic I50.31  History:         Patient has no prior history of Echocardiogram examinations.                  COPD, Arrythmias:Atrial Fibrillation; Risk  Factors:Hypertension.  Sonographer:     Cristela Blue RDCS (AE) Referring Phys:  [id number] [employer] Diagnosing Phys: Arnoldo Hooker MD  Sonographer Comments: Suboptimal apical window. IMPRESSIONS  1. Left ventricular ejection fraction, by estimation, is 50 to 55%. The left ventricle has low normal function. The left ventricle has no regional wall motion abnormalities. Left ventricular diastolic parameters were normal.  2. Right ventricular systolic function is normal. The right ventricular size is normal.  3. Left atrial size was mildly dilated.  4. The mitral valve is normal in structure. Mild to moderate mitral valve regurgitation.  5. The aortic valve is normal in structure. Aortic valve regurgitation is trivial. FINDINGS  Left Ventricle: Left ventricular ejection fraction, by estimation, is 50 to 55%. The left ventricle has low normal function. The left ventricle has no regional wall motion abnormalities. The left ventricular internal cavity size was normal in size. There is no left ventricular hypertrophy. Left ventricular diastolic parameters were normal. Right Ventricle: The right ventricular size is normal. No increase in right ventricular wall thickness. Right ventricular systolic function is normal. Left Atrium: Left atrial size was mildly dilated. Right Atrium: Right atrial size was normal in size. Pericardium: There is no evidence of  pericardial effusion. Mitral Valve: The mitral valve is normal in structure. Mild to moderate mitral valve regurgitation. Tricuspid Valve: The tricuspid valve is normal in structure. Tricuspid valve regurgitation is mild. Aortic Valve: The aortic valve is normal in structure. Aortic valve regurgitation is trivial. Aortic valve mean gradient measures 6.0 mmHg. Aortic valve peak gradient measures 11.1 mmHg. Aortic valve area, by VTI measures 1.80 cm. Pulmonic Valve: The pulmonic valve was normal in structure. Pulmonic valve regurgitation is trivial. Aorta: The aortic root and ascending aorta are structurally normal, with no evidence of dilitation. IAS/Shunts: No atrial level shunt detected by color flow Doppler.  LEFT VENTRICLE PLAX 2D LVIDd:         4.28 cm  Diastology LVIDs:         3.10 cm  LV e' medial:    6.96 cm/s LV PW:         1.86 cm  LV E/e' medial:  14.5 LV IVS:        1.58 cm  LV e' lateral:   8.16 cm/s LVOT diam:     2.00 cm  LV E/e' lateral: 12.4 LV SV:         58 LV SV Index:   25 LVOT Area:     3.14 cm  RIGHT VENTRICLE RV Basal diam:  3.32 cm LEFT ATRIUM            Index       RIGHT ATRIUM           Index LA diam:      5.40 cm  2.35 cm/m  RA Area:     33.80 cm LA Vol (A4C): 126.0 ml 54.85 ml/m RA Volume:   114.00 ml 49.62 ml/m  AORTIC VALVE                    PULMONIC VALVE AV Area (Vmax):    1.73 cm     PV Vmax:        0.78 m/s AV Area (Vmean):   1.74 cm     PV Peak grad:   2.4 mmHg AV Area (VTI):     1.80 cm     RVOT Peak grad: 4 mmHg AV Vmax:  166.33 cm/s AV Vmean:          113.667 cm/s AV VTI:            0.323 m AV Peak Grad:      11.1 mmHg AV Mean Grad:      6.0 mmHg LVOT Vmax:         91.40 cm/s LVOT Vmean:        63.000 cm/s LVOT VTI:          0.185 m LVOT/AV VTI ratio: 0.57  AORTA Ao Root diam: 3.10 cm MITRAL VALVE                TRICUSPID VALVE MV Area (PHT): 6.65 cm     TR Peak grad:   25.0 mmHg MV Decel Time: 114 msec     TR Vmax:        250.00 cm/s MV E velocity: 101.00  cm/s MV A velocity: 54.80 cm/s   SHUNTS MV E/A ratio:  1.84         Systemic VTI:  0.18 m                             Systemic Diam: 2.00 cm Arnoldo Hooker MD Electronically signed by Arnoldo Hooker MD Signature Date/Time: 01/11/2021/5:19:32 PM    Final         Scheduled Meds:  amLODipine  5 mg Oral Daily   aspirin EC  325 mg Oral Daily   atorvastatin  10 mg Oral QHS   cholecalciferol  1,000 Units Oral Daily   citalopram  10 mg Oral Daily   enoxaparin (LOVENOX) injection  60 mg Subcutaneous Q24H   finasteride  5 mg Oral Daily   furosemide  40 mg Intravenous Daily   gabapentin  100 mg Oral BID   irbesartan  150 mg Oral Daily   loratadine  10 mg Oral Daily   metoprolol tartrate  50 mg Oral BID   mirtazapine  15 mg Oral QHS   multivitamin with minerals  1 tablet Oral Daily   omega-3 acid ethyl esters  1 g Oral BID   psyllium  1 packet Oral Daily   sodium chloride flush  3 mL Intravenous Q12H   Continuous Infusions:  sodium chloride      Assessment & Plan:   Principal Problem:   Acute respiratory failure (HCC) Active Problems:   Acute CHF (congestive heart failure) (HCC)   Atrial fibrillation (HCC)   Depression   Obesity, morbid, BMI 40.0-49.9 (HCC)   Chronic respiratory failure (HCC)   Acute on chronic diastolic CHF (congestive heart failure) (HCC)   Acute on chronic respiratory failure Secondary to acute diastolic dysfunction CHF Patient has a history of chronic respiratory failure and at baseline wears 3 L of oxygen. He presented to the ER in respiratory distress and was said to have room air pulse oximetry in the mid 80s.  He was placed on BiPAP to reduce work of breathing and improve oxygenation. 6/10-weaned off of BiPAP.  Currently on 4 L.  Appears to be short of breath with conversation. Chest x-ray and CT scan reviewed, with pleural effusion Discussed with patient about thoracentesis and he is agreeable Will consult IR for thoracentesis       Acute on chronic  diastolic dysfunction CHF Most likely flash pulmonary edema from hypertensive urgency. Patient had a blood pressure of 184/122 upon arrival to the emergency room Optimize blood pressure control, continue  metoprolol and amlodipine 6/10-echo pending Cardiology consulted Continue lasix 47m g iv daily Continue beta blk             Chronic atrial fibrillation Rate control with metoprolol no anticoagulation due to concerns of fall risk and patient wishes at this time Continue aspirin        Depression Continue mirtazapine and citalopram         Morbid obesity (BMI 42.2 kg/m2) Complicates overall prognosis and care       COPD with chronic respiratory failure Not acutely exacerbated Continue as needed bronchodilator therapy   DVT prophylaxis: Lovenox Code Status: DNR Family Communication: Family at bedside Disposition Plan: Back to previous home life Status is: Inpatient  Remains inpatient appropriate because:Inpatient level of care appropriate due to severity of illness  Dispo: The patient is from: Home              Anticipated d/c is to: Home              Patient currently is not medically stable to d/c.   Difficult to place patient No            LOS: 1 day   Time spent: 35 min with More than 50% on COC    Lynn Ito, MD [facility] Pager [phone]  If 7PM-7AM, please contact night-coverage 01/12/2021, 8:19 AM

## 2021-01-12 NOTE — Progress Notes (Addendum)
   Heart Failure Nurse Navigator Note   Met with patient and son who is at the bedside.  He states that he does not feel as good today as he did yesterday.  Continues with congested sounding cough.  Discussed sodium restriction and fluid restriction, son states he feels his stays within the limit on sodium, restriction and fluid restrication.  He is continues to weigh himself daily.  He misses the fact that he is unable to join his friends in the cafeteria for meals and not feeling well and they help to get to the office we can tolerate an activity.  Son states that he spends a good share of his stay in recliner.  His feet are hanging down,suggested  support stocking to help with controlling the edema.  He voices understanding.  Also discussed the various types of support stockings  Also discussed with him the importance of following up in the heart failure clinic, explained to them that his not meant to take the place of the cardiologist or PCP, but is another resource without need to stay out of the hospital.  Pricilla Riffle RN [facility]

## 2021-01-12 NOTE — Procedures (Signed)
Interventional Radiology Procedure Note  Procedure: Left thoracentesis  Indication: Pleural effusion.  Findings: Please refer to procedural dictation for full description.  Complications: None  EBL: < 10 mL  Acquanetta Belling, MD [phone]

## 2021-01-13 DIAGNOSIS — F32A Depression, unspecified: Secondary | ICD-10-CM

## 2021-01-13 LAB — BASIC METABOLIC PANEL
Anion gap: 6 (ref 5–15)
BUN: 15 mg/dL (ref 8–23)
CO2: 39 mmol/L — ABNORMAL HIGH (ref 22–32)
Calcium: 8.9 mg/dL (ref 8.9–10.3)
Chloride: 92 mmol/L — ABNORMAL LOW (ref 98–111)
Creatinine, Ser: 0.86 mg/dL (ref 0.61–1.24)
GFR, Estimated: >60 mL/min (ref >60)
Glucose, Bld: 95 mg/dL (ref 70–99)
Potassium: 4 mmol/L (ref 3.5–5.1)
Sodium: 137 mmol/L (ref 135–145)

## 2021-01-13 LAB — BRAIN NATRIURETIC PEPTIDE: B Natriuretic Peptide: 360.4 pg/mL — ABNORMAL HIGH (ref 0.0–100.0)

## 2021-01-13 MED ORDER — ENOXAPARIN SODIUM 60 MG/0.6ML IJ SOSY
0.5000 mg/kg | PREFILLED_SYRINGE | INTRAMUSCULAR | Status: DC
  Administered 2021-01-13: 45 mg via SUBCUTANEOUS
  Filled 2021-01-13 (×2): qty 0.45

## 2021-01-13 MED ORDER — AMLODIPINE BESYLATE 10 MG PO TABS
10.0000 mg | ORAL_TABLET | Freq: Every day | ORAL | Status: DC
  Administered 2021-01-13 – 2021-01-14 (×2): 10 mg via ORAL
  Filled 2021-01-13 (×2): qty 1

## 2021-01-13 NOTE — Progress Notes (Signed)
[facility]  Patient: Brian SellerJohn A Poyer / Admit Date: 01/11/2021 / Date of Encounter: 01/13/2021, 6:57 AM   Subjective: Patient has tolerated thoracentesis well which has improved his shortness of breath at this time.  Overall urine output 1700 mils.  Patient is breathing better today.  No evidence of acute coronary syndrome and or anginal symptoms at this time.  Telemetry shows atrial fibrillation with controlled ventricular rate.  Review of Systems: Positive for: Shortness of breath Negative for: Vision change, hearing change, syncope, dizziness, nausea, vomiting,diarrhea, bloody stool, stomach pain, cough, congestion, diaphoresis, urinary frequency, urinary pain,skin lesions, skin rashes Others previously listed  Objective: Telemetry: Atrial fibrillation with controlled ventricular rate Physical Exam: Blood pressure (!) 166/81, pulse 65, temperature 97.9 F (36.6 C), resp. rate 16, height 5\' 7"  (1.702 m), weight 92.3 kg, SpO2 96 %. Body mass index is 31.87 kg/m. General: Well developed, well nourished, in no acute distress. Head: Normocephalic, atraumatic, sclera non-icteric, no xanthomas, nares are without discharge. Neck: No apparent masses Lungs: Normal respirations with few wheezes, no rhonchi, basilar rales , no crackles   Heart: Irregular few rate and rhythm, normal S1 S2, no murmur, no rub, no gallop, PMI is normal size and placement, carotid upstroke normal without bruit, jugular venous pressure normal Abdomen: Soft, non-tender, non-distended with normoactive bowel sounds. No hepatosplenomegaly. Abdominal aorta is normal size without bruit Extremities: EXTR 1+ edema, no clubbing, no cyanosis, no ulcers,  Peripheral: 2+ radial, 2+ femoral, 2+ dorsal pedal pulses Neuro: Alert and oriented. Moves all extremities spontaneously. Psych:  Responds to questions appropriately with a normal affect.   Intake/Output Summary (Last 24 hours) at 01/13/2021  0657 Last data filed at 01/12/2021 1454 Gross per 24 hour  Intake 120 ml  Output 1100 ml  Net -980 ml     Inpatient Medications:   amLODipine  5 mg Oral Daily   vitamin C  250 mg Oral BID   aspirin EC  325 mg Oral Daily   atorvastatin  10 mg Oral QHS   cholecalciferol  1,000 Units Oral Daily   citalopram  10 mg Oral Daily   enoxaparin (LOVENOX) injection  60 mg Subcutaneous Q24H   feeding supplement  237 mL Oral BID BM   finasteride  5 mg Oral Daily   furosemide  40 mg Intravenous Daily   gabapentin  100 mg Oral BID   loratadine  10 mg Oral Daily   metoprolol tartrate  50 mg Oral BID   mirtazapine  15 mg Oral QHS   multivitamin with minerals  1 tablet Oral Daily   omega-3 acid ethyl esters  1 g Oral BID   psyllium  1 packet Oral Daily   sodium chloride flush  3 mL Intravenous Q12H   Infusions:   sodium chloride      Labs: Recent Labs    01/12/21 0708 01/13/21 0536  NA 135 137  K 5.0 4.0  CL 93* 92*  CO2 34* 39*  GLUCOSE 111* 95  BUN 13 15  CREATININE 0.89 0.86  CALCIUM 8.8* 8.9    Recent Labs    01/11/21 0627  AST 21  ALT 10  ALKPHOS 69  BILITOT 1.0  PROT 7.5  ALBUMIN 3.8    Recent Labs    01/11/21 0627  WBC 11.4*  NEUTROABS 5.6  HGB 14.3  HCT 44.5  MCV 96.7  PLT 208    No results for input(s): CKTOTAL, CKMB, TROPONINI in the last 72 hours. Invalid  input(s): POCBNP No results for input(s): HGBA1C in the last 72 hours.   Weights: Filed Weights   01/11/21 0627 01/12/21 0500 01/13/21 0421  Weight: 122.5 kg 92.6 kg 92.3 kg     Radiology/Studies:  DG Chest 1 View  Result Date: 01/11/2021 CLINICAL DATA:  Shortness of breath. diff breathing; bleaky; 15L non rebreaher; rhonchi EXAM: CHEST  1 VIEW COMPARISON:  Chest x-ray 11/10/2020. FINDINGS: Persistent enlarged cardiac silhouette. The heart size and mediastinal contours are otherwise unchanged. Aortic calcification. Interval development of bilateral nodular lesions:a 6 mm right mid lung zone  and a 6mm left mid lung zone noted. Increased interstitial markings. Small to moderate size left pleural effusion. No pneumothorax. No acute osseous abnormality. IMPRESSION: 1. Increased interstitial markings as well as bilateral pulmonary nodules. 2. Small to moderate volume left pleural effusion. 3. Cardiomegaly. 4. Recommend CT chest with intravenous contrast for further evaluation. Electronically Signed   By: Tish Frederickson M.D.   On: 01/11/2021 06:49   CT Angio Chest PE W and/or Wo Contrast  Result Date: 01/11/2021 CLINICAL DATA:  Shortness of breath, cough. EXAM: CT ANGIOGRAPHY CHEST WITH CONTRAST TECHNIQUE: Multidetector CT imaging of the chest was performed using the standard protocol during bolus administration of intravenous contrast. Multiplanar CT image reconstructions and MIPs were obtained to evaluate the vascular anatomy. CONTRAST:  75mL OMNIPAQUE IOHEXOL 350 MG/ML SOLN COMPARISON:  None. FINDINGS: Cardiovascular: No filling defects in the pulmonary arteries to suggest pulmonary emboli. Cardiomegaly. Diffuse coronary artery calcifications and aortic calcifications throughout the aortic arch and descending thoracic aorta. No aneurysm. Mediastinum/Nodes: No mediastinal, hilar, or axillary adenopathy. Trachea and esophagus are unremarkable. Enlarged left thyroid lobe which extends into the substernal region. Lungs/Pleura: Bilateral moderate to large pleural effusions, left greater than right. Compressive atelectasis in the lower lobes and in the lingula. Upper Abdomen: Imaging into the upper abdomen demonstrates no acute findings. Musculoskeletal: Chest wall soft tissues are unremarkable. No acute bony abnormality. Review of the MIP images confirms the above findings. IMPRESSION: No evidence of pulmonary embolus. Cardiomegaly, diffuse coronary artery disease. Moderate to large bilateral pleural effusions with compressive atelectasis, left greater than right. Enlarged left thyroid lobe with substernal  extension. In the setting of significant comorbidities or limited life expectancy, no follow-up recommended (ref: J Am Coll Radiol. 2015 Feb;12(2): 143-50). Aortic Atherosclerosis (ICD10-I70.0). Electronically Signed   By: Charlett Nose M.D.   On: 01/11/2021 10:33   DG Chest Port 1 View  Result Date: 01/12/2021 CLINICAL DATA:  Status post left-sided thoracentesis EXAM: PORTABLE CHEST 1 VIEW COMPARISON:  01/11/2021 FINDINGS: Patient rotated left. Moderate cardiomegaly. Left-sided pleural effusion is moderate, similar. Small right pleural effusion is new or increased. Question loculation laterally. No pneumothorax. Interstitial prominence and indistinctness is moderate, progressive. Persistent left and developing right base airspace disease. IMPRESSION: No pneumothorax or other complication after thoracentesis. Cardiomegaly with congestive heart failure, increased. Persistent left and developing right pleural effusions. Bibasilar airspace disease which could represent atelectasis or infection. Electronically Signed   By: Jeronimo Greaves M.D.   On: 01/12/2021 17:33   ECHOCARDIOGRAM COMPLETE  Result Date: 01/11/2021    ECHOCARDIOGRAM REPORT   Patient Name:   ITHAN TOUHEY Sarra Date of Exam: 01/11/2021 Medical Rec #:  [id number]        Height:       67.0 in Accession #:    [id number]       Weight:       270.0 lb Date of Birth:  12-24-1925  BSA:          2.297 m Patient Age:    89 years         BP:           122/72 mmHg Patient Gender: M                HR:           67 bpm. Exam Location:  [facility] Procedure: 2D Echo, Cardiac Doppler and Color Doppler Indications:     CHF-acute diastolic I50.31  History:         Patient has no prior history of Echocardiogram examinations.                  COPD, Arrythmias:Atrial Fibrillation; Risk                  Factors:Hypertension.  Sonographer:     Cristela Blue RDCS (AE) Referring Phys:  [id number] Lucile Shutters Diagnosing Phys: Arnoldo Hooker MD  Sonographer Comments: Suboptimal apical  window. IMPRESSIONS  1. Left ventricular ejection fraction, by estimation, is 50 to 55%. The left ventricle has low normal function. The left ventricle has no regional wall motion abnormalities. Left ventricular diastolic parameters were normal.  2. Right ventricular systolic function is normal. The right ventricular size is normal.  3. Left atrial size was mildly dilated.  4. The mitral valve is normal in structure. Mild to moderate mitral valve regurgitation.  5. The aortic valve is normal in structure. Aortic valve regurgitation is trivial. FINDINGS  Left Ventricle: Left ventricular ejection fraction, by estimation, is 50 to 55%. The left ventricle has low normal function. The left ventricle has no regional wall motion abnormalities. The left ventricular internal cavity size was normal in size. There is no left ventricular hypertrophy. Left ventricular diastolic parameters were normal. Right Ventricle: The right ventricular size is normal. No increase in right ventricular wall thickness. Right ventricular systolic function is normal. Left Atrium: Left atrial size was mildly dilated. Right Atrium: Right atrial size was normal in size. Pericardium: There is no evidence of pericardial effusion. Mitral Valve: The mitral valve is normal in structure. Mild to moderate mitral valve regurgitation. Tricuspid Valve: The tricuspid valve is normal in structure. Tricuspid valve regurgitation is mild. Aortic Valve: The aortic valve is normal in structure. Aortic valve regurgitation is trivial. Aortic valve mean gradient measures 6.0 mmHg. Aortic valve peak gradient measures 11.1 mmHg. Aortic valve area, by VTI measures 1.80 cm. Pulmonic Valve: The pulmonic valve was normal in structure. Pulmonic valve regurgitation is trivial. Aorta: The aortic root and ascending aorta are structurally normal, with no evidence of dilitation. IAS/Shunts: No atrial level shunt detected by color flow Doppler.  LEFT VENTRICLE PLAX 2D LVIDd:          4.28 cm  Diastology LVIDs:         3.10 cm  LV e' medial:    6.96 cm/s LV PW:         1.86 cm  LV E/e' medial:  14.5 LV IVS:        1.58 cm  LV e' lateral:   8.16 cm/s LVOT diam:     2.00 cm  LV E/e' lateral: 12.4 LV SV:         58 LV SV Index:   25 LVOT Area:     3.14 cm  RIGHT VENTRICLE RV Basal diam:  3.32 cm LEFT ATRIUM  Index       RIGHT ATRIUM           Index LA diam:      5.40 cm  2.35 cm/m  RA Area:     33.80 cm LA Vol (A4C): 126.0 ml 54.85 ml/m RA Volume:   114.00 ml 49.62 ml/m  AORTIC VALVE                    PULMONIC VALVE AV Area (Vmax):    1.73 cm     PV Vmax:        0.78 m/s AV Area (Vmean):   1.74 cm     PV Peak grad:   2.4 mmHg AV Area (VTI):     1.80 cm     RVOT Peak grad: 4 mmHg AV Vmax:           166.33 cm/s AV Vmean:          113.667 cm/s AV VTI:            0.323 m AV Peak Grad:      11.1 mmHg AV Mean Grad:      6.0 mmHg LVOT Vmax:         91.40 cm/s LVOT Vmean:        63.000 cm/s LVOT VTI:          0.185 m LVOT/AV VTI ratio: 0.57  AORTA Ao Root diam: 3.10 cm MITRAL VALVE                TRICUSPID VALVE MV Area (PHT): 6.65 cm     TR Peak grad:   25.0 mmHg MV Decel Time: 114 msec     TR Vmax:        250.00 cm/s MV E velocity: 101.00 cm/s MV A velocity: 54.80 cm/s   SHUNTS MV E/A ratio:  1.84         Systemic VTI:  0.18 m                             Systemic Diam: 2.00 cm Arnoldo Hooker MD Electronically signed by Arnoldo Hooker MD Signature Date/Time: 01/11/2021/5:19:32 PM    Final    US THORACENTESIS ASP PLEURAL SPACE W/IMG GUIDE  Result Date: 01/12/2021 INDICATION: Bilateral pleural effusions. EXAM: ULTRASOUND GUIDED LEFT THORACENTESIS MEDICATIONS: None. COMPLICATIONS: None immediate. PROCEDURE: An ultrasound guided thoracentesis was thoroughly discussed with the patient and questions answered. The benefits, risks, alternatives and complications were also discussed. The patient understands and wishes to proceed with the procedure. Written consent was obtained. Ultrasound  was performed to localize and mark an adequate pocket of fluid in the left chest. The area was then prepped and draped in the normal sterile fashion. 1% Lidocaine was used for local anesthesia. Under continuous ultrasound guidance a 19 gauge, 7-cm, Yueh catheter was introduced. Thoracentesis was performed. The catheter was removed and a dressing applied. FINDINGS: A total of approximately 1.2 L of clear straw-colored fluid was removed. Samples were sent to the laboratory as requested by the clinical team. IMPRESSION: Successful ultrasound guided left thoracentesis yielding 1.2 L of clear straw-colored of pleural fluid. Ultrasound evaluation of the right chest demonstrates small amount of fluid which was not amenable to aspiration. Electronically Signed   By: Acquanetta Belling M.D.   On: 01/12/2021 16:58     Assessment and Recommendation  87 y.o. male with acute on chronic diastolic dysfunction congestive heart failure with hypertension  hyperlipidemia and severe COPD with oxygen requirement and hypertension without evidence of myocardial infarction and/or acute coronary syndrome 1.  Continue intravenous diuresis with Lasix at current dosage for further better urine output and acute on chronic diastolic dysfunction congestive heart failure.  Okay for changing furosemide to 40 mg p.o. daily when discharged 2.  Continue hypertension control with amlodipine and addition of angiotensin receptor blocker with a goal systolic blood pressure below 937 mm 3.  Continue oxygenation for chronic obstructive pulmonary disease and hypoxia 4.  Metoprolol for heart rate control of atrial fibrillation without change today 5.  No use of anticoagulation at this time due to concerns of fall risk and patient wishes at this time 6.  No further cardiac diagnostics necessary at this time 7.  If ambulating well okay for discharged home from cardiac standpoint with follow-up next week Signed, Arnoldo Hooker M.D. FACC

## 2021-01-13 NOTE — Progress Notes (Signed)
PROGRESS NOTE    Brian Lara  WFU:[id number] DOB: [birth year] DOA: 01/11/2021 PCP: Mick Sell, MD    Brief Narrative:  Brian Lara is a 87 y.o. male with medical history significant for atrial fibrillation not on anticoagulation, COPD with chronic respiratory failure on 3 L of oxygen, history of chronic diastolic dysfunction CHF with last known LVEF of 55%, hypertension and dyslipidemia who was brought into the ER by EMS for evaluation of respiratory distress. Patient states he went to bed in his usual state of health and woke up in the early hours of the morning with difficulty breathing.  He had his oxygen on at 3 L but felt he was not getting enough air.  Per EMS room air pulse oximetry was in the upper 80s.  He has some chest discomfort over the left anterior chest wall but denied having any chest pain.  He has lower extremity swelling.  6/11-status post left thoracentesis yesterday.  Feels that his breathing is improving after this.  Less tachypneic with conversation today.  Consultants:  cardiology  Procedures:   Antimicrobials:      Subjective: Sob improving, no palpitations, dizziness, cp  Objective: Vitals:   01/13/21 0339 01/13/21 0421 01/13/21 0722 01/13/21 1146  BP: (!) 166/81  (!) 159/73 (!) 117/59  Pulse: 65  (!) 56 (!) 56  Resp: 16  18 16   Temp: 97.9 F (36.6 C)  97.8 F (36.6 C) 98.7 F (37.1 C)  TempSrc:   Oral Oral  SpO2: 96%  98% 96%  Weight:  92.3 kg    Height:        Intake/Output Summary (Last 24 hours) at 01/13/2021 1259 Last data filed at 01/13/2021 1146 Gross per 24 hour  Intake --  Output 1750 ml  Net -1750 ml   Filed Weights   01/11/21 0627 01/12/21 0500 01/13/21 0421  Weight: 122.5 kg 92.6 kg 92.3 kg    Examination: Nad, calm, nontachypnic Decrease bs at bases, overall improving Reg,-irreg no gallop Soft benign +bs +edema b/l Awake and alert, grossly intact Mood and affect appropriate in current setting    Data  Reviewed: I have personally reviewed following labs and imaging studies  CBC: Recent Labs  Lab 01/11/21 0627  WBC 11.4*  NEUTROABS 5.6  HGB 14.3  HCT 44.5  MCV 96.7  PLT 208   Basic Metabolic Panel: Recent Labs  Lab 01/11/21 0627 01/12/21 0708 01/13/21 0536  NA 138 135 137  K 3.5 5.0 4.0  CL 92* 93* 92*  CO2 34* 34* 39*  GLUCOSE 132* 111* 95  BUN 10 13 15   CREATININE 0.94 0.89 0.86  CALCIUM 9.0 8.8* 8.9   GFR: Estimated Creatinine Clearance: 59.4 mL/min (by C-G formula based on SCr of 0.86 mg/dL). Liver Function Tests: Recent Labs  Lab 01/11/21 0627  AST 21  ALT 10  ALKPHOS 69  BILITOT 1.0  PROT 7.5  ALBUMIN 3.8   No results for input(s): LIPASE, AMYLASE in the last 168 hours. No results for input(s): AMMONIA in the last 168 hours. Coagulation Profile: No results for input(s): INR, PROTIME in the last 168 hours. Cardiac Enzymes: No results for input(s): CKTOTAL, CKMB, CKMBINDEX, TROPONINI in the last 168 hours. BNP (last 3 results) No results for input(s): PROBNP in the last 8760 hours. HbA1C: No results for input(s): HGBA1C in the last 72 hours. CBG: No results for input(s): GLUCAP in the last 168 hours. Lipid Profile: No results for input(s): CHOL, HDL, LDLCALC, TRIG, CHOLHDL,  LDLDIRECT in the last 72 hours. Thyroid Function Tests: No results for input(s): TSH, T4TOTAL, FREET4, T3FREE, THYROIDAB in the last 72 hours. Anemia Panel: No results for input(s): VITAMINB12, FOLATE, FERRITIN, TIBC, IRON, RETICCTPCT in the last 72 hours. Sepsis Labs: Recent Labs  Lab 01/11/21 0867  PROCALCITON <0.10    Recent Results (from the past 240 hour(s))  Resp Panel by RT-PCR (Flu A&B, Covid) Nasopharyngeal Swab     Status: None   Collection Time: 01/11/21  6:27 AM   Specimen: Nasopharyngeal Swab; Nasopharyngeal(NP) swabs in vial transport medium  Result Value Ref Range Status   SARS Coronavirus 2 by RT PCR NEGATIVE NEGATIVE Final    Comment: (NOTE) SARS-CoV-2  target nucleic acids are NOT DETECTED.  The SARS-CoV-2 RNA is generally detectable in upper respiratory specimens during the acute phase of infection. The lowest concentration of SARS-CoV-2 viral copies this assay can detect is 138 copies/mL. A negative result does not preclude SARS-Cov-2 infection and should not be used as the sole basis for treatment or other patient management decisions. A negative result may occur with  improper specimen collection/handling, submission of specimen other than nasopharyngeal swab, presence of viral mutation(s) within the areas targeted by this assay, and inadequate number of viral copies(<138 copies/mL). A negative result must be combined with clinical observations, patient history, and epidemiological information. The expected result is Negative.  Fact Sheet for Patients:  BloggerCourse.com  Fact Sheet for Healthcare Providers:  SeriousBroker.it  This test is no t yet approved or cleared by the [place] FDA and  has been authorized for detection and/or diagnosis of SARS-CoV-2 by FDA under an Emergency Use Authorization (EUA). This EUA will remain  in effect (meaning this test can be used) for the duration of the COVID-19 declaration under Section 564(b)(1) of the Act, 21 U.S.C.section 360bbb-3(b)(1), unless the authorization is terminated  or revoked sooner.       Influenza A by PCR NEGATIVE NEGATIVE Final   Influenza B by PCR NEGATIVE NEGATIVE Final    Comment: (NOTE) The Xpert Xpress SARS-CoV-2/FLU/RSV plus assay is intended as an aid in the diagnosis of influenza from Nasopharyngeal swab specimens and should not be used as a sole basis for treatment. Nasal washings and aspirates are unacceptable for Xpert Xpress SARS-CoV-2/FLU/RSV testing.  Fact Sheet for Patients: BloggerCourse.com  Fact Sheet for Healthcare  Providers: SeriousBroker.it  This test is not yet approved or cleared by the [place] FDA and has been authorized for detection and/or diagnosis of SARS-CoV-2 by FDA under an Emergency Use Authorization (EUA). This EUA will remain in effect (meaning this test can be used) for the duration of the COVID-19 declaration under Section 564(b)(1) of the Act, 21 U.S.C. section 360bbb-3(b)(1), unless the authorization is terminated or revoked.  Performed at [facility], [address] [place], [place] [address]   Blood culture (single)     Status: None (Preliminary result)   Collection Time: 01/11/21  9:16 AM   Specimen: BLOOD  Result Value Ref Range Status   Specimen Description BLOOD BLOOD LEFT WRIST  Final   Special Requests   Final    BOTTLES DRAWN AEROBIC AND ANAEROBIC Blood Culture adequate volume   Culture   Final    NO GROWTH 2 DAYS Performed at [facility], [address], [place], [place] [address]    Report Status PENDING  Incomplete         Radiology Studies: DG Chest Port 1 View  Result Date: 01/12/2021 CLINICAL DATA:  Status post left-sided thoracentesis EXAM: PORTABLE CHEST 1 VIEW COMPARISON:  01/11/2021 FINDINGS: Patient rotated left. Moderate cardiomegaly. Left-sided pleural effusion is moderate, similar. Small right pleural effusion is new or increased. Question loculation laterally. No pneumothorax. Interstitial prominence and indistinctness is moderate, progressive. Persistent left and developing right base airspace disease. IMPRESSION: No pneumothorax or other complication after thoracentesis. Cardiomegaly with congestive heart failure, increased. Persistent left and developing right pleural effusions. Bibasilar airspace disease which could represent atelectasis or infection. Electronically Signed   By: Jeronimo GreavesKyle  Talbot M.D.   On: 01/12/2021 17:33   US THORACENTESIS ASP PLEURAL SPACE W/IMG GUIDE  Result Date:  01/12/2021 INDICATION: Bilateral pleural effusions. EXAM: ULTRASOUND GUIDED LEFT THORACENTESIS MEDICATIONS: None. COMPLICATIONS: None immediate. PROCEDURE: An ultrasound guided thoracentesis was thoroughly discussed with the patient and questions answered. The benefits, risks, alternatives and complications were also discussed. The patient understands and wishes to proceed with the procedure. Written consent was obtained. Ultrasound was performed to localize and mark an adequate pocket of fluid in the left chest. The area was then prepped and draped in the normal sterile fashion. 1% Lidocaine was used for local anesthesia. Under continuous ultrasound guidance a 19 gauge, 7-cm, Yueh catheter was introduced. Thoracentesis was performed. The catheter was removed and a dressing applied. FINDINGS: A total of approximately 1.2 L of clear straw-colored fluid was removed. Samples were sent to the laboratory as requested by the clinical team. IMPRESSION: Successful ultrasound guided left thoracentesis yielding 1.2 L of clear straw-colored of pleural fluid. Ultrasound evaluation of the right chest demonstrates small amount of fluid which was not amenable to aspiration. Electronically Signed   By: Acquanetta BellingFarhaan  Mir M.D.   On: 01/12/2021 16:58        Scheduled Meds:  amLODipine  10 mg Oral Daily   vitamin C  250 mg Oral BID   aspirin EC  325 mg Oral Daily   atorvastatin  10 mg Oral QHS   cholecalciferol  1,000 Units Oral Daily   citalopram  10 mg Oral Daily   enoxaparin (LOVENOX) injection  0.5 mg/kg Subcutaneous Q24H   feeding supplement  237 mL Oral BID BM   finasteride  5 mg Oral Daily   furosemide  40 mg Intravenous Daily   gabapentin  100 mg Oral BID   loratadine  10 mg Oral Daily   metoprolol tartrate  50 mg Oral BID   mirtazapine  15 mg Oral QHS   multivitamin with minerals  1 tablet Oral Daily   omega-3 acid ethyl esters  1 g Oral BID   psyllium  1 packet Oral Daily   sodium chloride flush  3 mL  Intravenous Q12H   Continuous Infusions:  sodium chloride      Assessment & Plan:   Principal Problem:   Acute respiratory failure (HCC) Active Problems:   Acute CHF (congestive heart failure) (HCC)   Atrial fibrillation (HCC)   Depression   Obesity, morbid, BMI 40.0-49.9 (HCC)   Chronic respiratory failure (HCC)   Acute on chronic diastolic CHF (congestive heart failure) (HCC)   Pressure injury of skin   Acute on chronic respiratory failure Secondary to acute diastolic dysfunction CHF Patient has a history of chronic respiratory failure and at baseline wears 3 L of oxygen. He presented to the ER in respiratory distress and was said to have room air pulse oximetry in the mid 80s.  He was placed on BiPAP to reduce work of breathing and improve oxygenation. 6/10-weaned off of BiPAP.  Currently  on 4 L.  Appears to be short of breath with conversation. Chest x-ray and CT scan reviewed, with pleural effusion 6/11- s/p Lt thoracentesis with improving of his breathing Will wean him down as tolerated to his baseline which is 2 L       Acute on chronic diastolic dysfunction CHF Most likely flash pulmonary edema from hypertensive urgency. Patient had a blood pressure of 184/122 upon arrival to the emergency room Optimize blood pressure control, continue metoprolol and amlodipine 6/11 Echo with EF 50 to 55%.  No wall motion abnormality.  Diastolic parameters are normal.  Mild to moderate MR. Cardiology following BNP coming down Still volume overloaded Continue Lasix 40 mg IV daily Continue beta-blockers Blood pressure control I/o daily  Will need to dc on Lasix 40 mg p.o. daily and follow-up with cardiology in 1 week upon discharge              Chronic atrial fibrillation Rate control with metoprolol  no anticoagulation due to concerns of fall risk and patient wishes at this time 6/11 continue aspirin and metoprolol        Depression Continue mirtazapine and  citalopram           Morbid obesity (BMI 42.2 kg/m2) Complicates overall prognosis and care       COPD with chronic respiratory failure Not acutely exacerbated Continue as needed bronchodilator therapy   DVT prophylaxis: Lovenox Code Status: DNR Family Communication: Family at bedside Disposition Plan: Back to previous home life Status is: Inpatient  Remains inpatient appropriate because:Inpatient level of care appropriate due to severity of illness  Dispo: The patient is from: Home              Anticipated d/c is to: Home              Patient currently is not medically stable to d/c.   Difficult to place patient No    Anticipitated discharge: likely in am if clinically/medically stable.still volume overloaded and needs iv las        LOS: 2 days   Time spent: 35 min with More than 50% on COC    Lynn Ito, MD [facility] Pager [phone]  If 7PM-7AM, please contact night-coverage 01/13/2021, 12:59 PM

## 2021-01-13 NOTE — Evaluation (Signed)
Physical Therapy Evaluation Patient Details Name: Brian Lara MRN: [id number] DOB: 16-May-1926 Today's Date: 01/13/2021   History of Present Illness  Brian Lara is a 87 y.o. male with medical history significant for atrial fibrillation not on anticoagulation, COPD with chronic respiratory failure on 3 L of oxygen, history of chronic diastolic dysfunction CHF with last known LVEF of 55%, hypertension and dyslipidemia who was brought into the ER by EMS for evaluation of respiratory distress.  Clinical Impression  The pt presents this session in good spirits. He demonstrates ability to able a total of 200' with use of RW and intermittent breaks. The pt did not require oxygen titration for mobility. He is demonstrating safety to return home with HHPT once medically stable. PT will continue to follow while in house.     Follow Up Recommendations Home health PT    Equipment Recommendations       Recommendations for Other Services       Precautions / Restrictions Precautions Precautions: Fall      Mobility  Bed Mobility Overal bed mobility: Modified Independent             General bed mobility comments: HOB elevated, use of bed railings.    Transfers Overall transfer level: Needs assistance Equipment used: Rolling walker (2 wheeled) Transfers: Sit to/from Stand Sit to Stand: Min guard            Ambulation/Gait Ambulation/Gait assistance: Min guard Gait Distance (Feet): 50 Feet Assistive device: Rolling walker (2 wheeled) Gait Pattern/deviations: Shuffle;Narrow base of support;Trunk flexed     General Gait Details: 50' x4, standing rest breaks for O2 checks. Oxygen did not have to be titrated during mobility.  Stairs            Wheelchair Mobility    Modified Rankin (Stroke Patients Only)       Balance Overall balance assessment: Needs assistance Sitting-balance support: Feet supported;Bilateral upper extremity supported Sitting  balance-Leahy Scale: Good     Standing balance support: During functional activity;Bilateral upper extremity supported Standing balance-Leahy Scale: Fair                               Pertinent Vitals/Pain Pain Assessment: No/denies pain    Home Living Family/patient expects to be discharged to:: Private residence Living Arrangements: Alone Available Help at Discharge: Family;Available PRN/intermittently Type of Home: House Home Access: Level entry     Home Layout: One level Home Equipment: Cane - single point;Walker - standard;Shower seat;Grab bars - tub/shower Additional Comments: Pt lives in an independent living facility - pt's family able to assist with needs intermittently    Prior Function Level of Independence: Independent with assistive device(s)         Comments: Pt reports ability to ambulate 100yds multiple times daily to go to dining hall     Hand Dominance        Extremity/Trunk Assessment   Upper Extremity Assessment Upper Extremity Assessment: Overall WFL for tasks assessed    Lower Extremity Assessment Lower Extremity Assessment: Overall WFL for tasks assessed       Communication   Communication: No difficulties  Cognition Arousal/Alertness: Awake/alert Behavior During Therapy: WFL for tasks assessed/performed Overall Cognitive Status: Within Functional Limits for tasks assessed  General Comments      Exercises     Assessment/Plan    PT Assessment Patient needs continued PT services  PT Problem List Decreased activity tolerance;Decreased balance       PT Treatment Interventions Gait training;Functional mobility training;Balance training;Therapeutic exercise    PT Goals (Current goals can be found in the Care Plan section)  Acute Rehab PT Goals Patient Stated Goal: to return to walking program PT Goal Formulation: With patient Time For Goal Achievement:  01/27/21 Potential to Achieve Goals: Good    Frequency Min 2X/week   Barriers to discharge   Pt reports that his insurance will not pay for contracted PT through [facility].    Co-evaluation               AM-PAC PT "6 Clicks" Mobility  Outcome Measure Help needed turning from your back to your side while in a flat bed without using bedrails?: A Little Help needed moving from lying on your back to sitting on the side of a flat bed without using bedrails?: A Little Help needed moving to and from a bed to a chair (including a wheelchair)?: A Little Help needed standing up from a chair using your arms (e.g., wheelchair or bedside chair)?: A Little Help needed to walk in hospital room?: A Little Help needed climbing 3-5 steps with a railing? : A Lot 6 Click Score: 17    End of Session Equipment Utilized During Treatment: Gait belt Activity Tolerance: Patient tolerated treatment well Patient left: in bed;with bed alarm set Nurse Communication: Mobility status PT Visit Diagnosis: Unsteadiness on feet (R26.81)    Time: 1410-1437 PT Time Calculation (min) (ACUTE ONLY): 27 min   Charges:   PT Evaluation $PT Eval Moderate Complexity: 1 Mod PT Treatments $Gait Training: 8-22 mins        4:35 PM, 01/13/21 Kenya Kook A. Mordecai Maes PT, DPT Physical Therapist - [facility]   Jimmy Stipes A Jsean Taussig 01/13/2021, 4:33 PM

## 2021-01-13 NOTE — Progress Notes (Signed)
PO metoprolol not given due to pt's HR-55 BPM. MD made aware. Will continue to monitor.

## 2021-01-13 NOTE — Evaluation (Signed)
Occupational Therapy Evaluation Patient Details Name: Brian Lara MRN: [id number] DOB: 1926/05/07 Today's Date: 01/13/2021    History of Present Illness Brian Lara is a 87 y.o. male with medical history significant for atrial fibrillation not on anticoagulation, COPD with chronic respiratory failure on 3 L of oxygen, history of chronic diastolic dysfunction CHF with last known LVEF of 55%, hypertension and dyslipidemia who was brought into the ER by EMS for evaluation of respiratory distress.   Clinical Impression   Pt seen for OT evaluation this date in setting of acute hospitalization d/t respiratory distress. Pt reports feeling better this date. Pt states he is MOD I for fxl mobility and INDEP for self care at baseline at his ALF. Pt presents this date with some decreased fxl activity tolerance. On OT assessment, pt requires: SETUP for seated UB ADLs, MIN A for seated LB ADLs, CGA/SBA with HHA for ADL transfers. Pt will benefit from continued OT while in acute setting as well as HHOT f/u at ALF to ensure safety and offer energy conservation strategies.     Follow Up Recommendations  Home health OT    Equipment Recommendations  None recommended by OT    Recommendations for Other Services       Precautions / Restrictions Precautions Precautions: Fall Restrictions Weight Bearing Restrictions: No      Mobility Bed Mobility Overal bed mobility: Modified Independent             General bed mobility comments: HOB elevated, use of bed railings.    Transfers Overall transfer level: Needs assistance Equipment used: Rolling walker (2 wheeled) Transfers: Sit to/from Stand Sit to Stand: Min guard              Balance Overall balance assessment: Needs assistance Sitting-balance support: Feet supported;Bilateral upper extremity supported Sitting balance-Leahy Scale: Good     Standing balance support: During functional activity;Bilateral upper extremity  supported Standing balance-Leahy Scale: Fair                             ADL either performed or assessed with clinical judgement   ADL Overall ADL's : Needs assistance/impaired                                       General ADL Comments: SETUP for seated UB ADLs, MIN A for seated LB ADLs, CGA/SBA with HHA for ADL transfers     Vision Baseline Vision/History: Wears glasses Wears Glasses: At all times Patient Visual Report: No change from baseline       Perception     Praxis      Pertinent Vitals/Pain Pain Assessment: No/denies pain     Hand Dominance     Extremity/Trunk Assessment Upper Extremity Assessment Upper Extremity Assessment: Overall WFL for tasks assessed   Lower Extremity Assessment Lower Extremity Assessment: Overall WFL for tasks assessed       Communication Communication Communication: No difficulties   Cognition Arousal/Alertness: Awake/alert Behavior During Therapy: WFL for tasks assessed/performed Overall Cognitive Status: Within Functional Limits for tasks assessed                                     General Comments       Exercises Other Exercises Other Exercises: OT ed re:  role of OT in acute setting and importance of OOB activity. Pt with good understanding   Shoulder Instructions      Home Living Family/patient expects to be discharged to:: Private residence Living Arrangements: Alone Available Help at Discharge: Family;Available PRN/intermittently Type of Home: House Home Access: Level entry     Home Layout: One level     Bathroom Shower/Tub: Chief Strategy Officer: Standard Bathroom Accessibility: No   Home Equipment: Cane - single point;Walker - standard;Shower seat;Grab bars - tub/shower   Additional Comments: Pt lives in an independent living facility - pt's family able to assist with needs intermittently      Prior Functioning/Environment Level of Independence:  Independent with assistive device(s)        Comments: Pt reports ability to ambulate 100yds multiple times daily to go to dining hall        OT Problem List: Decreased strength;Decreased activity tolerance      OT Treatment/Interventions: Self-care/ADL training;Therapeutic activities;DME and/or AE instruction;Balance training;Therapeutic exercise;Energy conservation;Patient/family education    OT Goals(Current goals can be found in the care plan section) Acute Rehab OT Goals Patient Stated Goal: to return to walking program OT Goal Formulation: With patient Time For Goal Achievement: 01/27/21 Potential to Achieve Goals: Good ADL Goals Pt Will Perform Lower Body Dressing: with supervision;sit to/from stand Pt Will Transfer to Toilet: with supervision Pt Will Perform Toileting - Clothing Manipulation and hygiene: with supervision Pt/caregiver will Perform Home Exercise Program: Increased strength;Right Upper extremity;Left upper extremity;With Supervision  OT Frequency: Min 2X/week   Barriers to D/C:            Co-evaluation              AM-PAC OT "6 Clicks" Daily Activity     Outcome Measure Help from another person eating meals?: None Help from another person taking care of personal grooming?: None Help from another person toileting, which includes using toliet, bedpan, or urinal?: A Little Help from another person bathing (including washing, rinsing, drying)?: A Little Help from another person to put on and taking off regular upper body clothing?: A Little Help from another person to put on and taking off regular lower body clothing?: A Little 6 Click Score: 20   End of Session Equipment Utilized During Treatment: Rolling walker Nurse Communication: Mobility status  Activity Tolerance: Patient tolerated treatment well Patient left: Other (comment) (eated EOB with bed alarm on, eating from BST.)  OT Visit Diagnosis: Muscle weakness (generalized) (M62.81);Other  abnormalities of gait and mobility (R26.89)                Time: 3295-1884 OT Time Calculation (min): 26 min Charges:  OT General Charges $OT Visit: 1 Visit OT Evaluation $OT Eval Moderate Complexity: 1 Mod OT Treatments $Self Care/Home Management : 8-22 mins  Rejeana Brock, MS, OTR/L ascom [phone] 01/13/21, 5:36 PM

## 2021-01-14 LAB — BASIC METABOLIC PANEL
Anion gap: 7 (ref 5–15)
BUN: 13 mg/dL (ref 8–23)
CO2: 38 mmol/L — ABNORMAL HIGH (ref 22–32)
Calcium: 8.5 mg/dL — ABNORMAL LOW (ref 8.9–10.3)
Chloride: 90 mmol/L — ABNORMAL LOW (ref 98–111)
Creatinine, Ser: 0.81 mg/dL (ref 0.61–1.24)
GFR, Estimated: >60 mL/min (ref >60)
Glucose, Bld: 96 mg/dL (ref 70–99)
Potassium: 3.7 mmol/L (ref 3.5–5.1)
Sodium: 135 mmol/L (ref 135–145)

## 2021-01-14 LAB — CBC
HCT: 37.4 % — ABNORMAL LOW (ref 39.0–52.0)
Hemoglobin: 11.7 g/dL — ABNORMAL LOW (ref 13.0–17.0)
MCH: 30.6 pg (ref 26.0–34.0)
MCHC: 31.3 g/dL (ref 30.0–36.0)
MCV: 97.9 fL (ref 80.0–100.0)
Platelets: 175 10*3/uL (ref 150–400)
RBC: 3.82 MIL/uL — ABNORMAL LOW (ref 4.22–5.81)
RDW: 13.8 % (ref 11.5–15.5)
WBC: 8.5 10*3/uL (ref 4.0–10.5)
nRBC: 0 % (ref 0.0–0.2)

## 2021-01-14 MED ORDER — FUROSEMIDE 40 MG PO TABS: 40.0000 mg | ORAL_TABLET | Freq: Every day | ORAL | 0 refills | Status: DC

## 2021-01-14 MED ORDER — FUROSEMIDE 40 MG PO TABS: 40.0000 mg | ORAL_TABLET | Freq: Every day | ORAL | Status: DC

## 2021-01-14 MED ORDER — POTASSIUM CHLORIDE CRYS ER 10 MEQ PO TBCR
10.0000 meq | EXTENDED_RELEASE_TABLET | Freq: Once | ORAL | Status: AC
  Administered 2021-01-14: 10 meq via ORAL
  Filled 2021-01-14: qty 1

## 2021-01-14 MED ORDER — AMLODIPINE BESYLATE 10 MG PO TABS: 10.0000 mg | ORAL_TABLET | Freq: Every day | ORAL | 0 refills | Status: DC

## 2021-01-14 NOTE — TOC Progression Note (Signed)
Transition of Care [facility]) - Progression Note    Patient Details  Name: Brian Lara MRN: [id number] Date of Birth: 1926-07-02  Transition of Care [facility]) CM/SW Contact  Bing Quarry, RN Phone Number: 01/14/2021, 12:34 PM  Clinical Narrative:  Patient is to be discharged today. Home Health PT/OT ordered with discharge orders. Attempting to secure agency. Call in to [facility] which will accept patient's insurance. DME ordered via [employer] to be delivered within 60-90 minutes according to [facility] [employer]. Communicated this to Unit RN as patient's son has expressed impatience with having to wait. Gabriel Cirri RN CM      Expected Discharge Plan: Home/Self Care Barriers to Discharge: Continued Medical Work up  Expected Discharge Plan and Services Expected Discharge Plan: Home/Self Care       Living arrangements for the past 2 months: Single Family Home Expected Discharge Date: 01/14/21               DME Arranged: Oxygen DME Agency: [employer]                   Social Determinants of Health (SDOH) Interventions    Readmission Risk Interventions No flowsheet data found.

## 2021-01-14 NOTE — Discharge Summary (Signed)
Braxdon Gappa Kreisler RUE:[id number] DOB: Apr 11, 1926 DOA: 01/11/2021  PCP: Mick Sell, MD  Admit date: 01/11/2021 Discharge date: 01/14/2021  Admitted From: Home Disposition: Home  Recommendations for Outpatient Follow-up:  Follow up with PCP in 1 week Please obtain BMP/CBC in one week Please follow up with cardiology in 1 week  Home Health: Yes   Discharge Condition:Stable CODE STATUS: DNR Diet recommendation: Heart Healthy  Brief/Interim Summary: Per JYN:WGNF A Michaux is a 87 y.o. male with medical history significant for atrial fibrillation not on anticoagulation, COPD with chronic respiratory failure on 3 L of oxygen, history of chronic diastolic dysfunction CHF with last known LVEF of 55%, hypertension and dyslipidemia who was brought into the ER by EMS for evaluation of respiratory distress. Patient stated he went to bed in his usual state of health and woke up in the early hours of the morning with difficulty breathing.  He had his oxygen on at 3 L but felt he was not getting enough air.  Per EMS room air pulse oximetry was in the upper 80s. He was found with moderate to large bilateral pleural effusions with compressive atelectasis, left greater than right on CT chest. Negative for Pulmonary emoblism. He was admitted for CHF management.  Cardiology was consulted. He was diuresed. He underwent left thoracentesis. Today he is on 4 L 02, and he can be weaned down to 3L as outpatient.   Acute on chronic respiratory failure Secondary to acute diastolic dysfunction CHF Patient has a history of chronic respiratory failure and at baseline wears 3 L of oxygen. He was weaned off of BiPAP.  Currently on 4 L. Clinically improved. 6/10-s/p Lt thoracentesis with improving of his breathing        Acute on chronic diastolic dysfunction CHF Most likely flash pulmonary edema from hypertensive urgency. Patient had a blood pressure of 184/122 upon arrival to the emergency room Echo with EF  50 to 55%.  No wall motion abnormality.  Diastolic parameters are normal.  Mild to moderate MR. Cardiology following BNP coming down More euvolemic on exam and clinically better. Was on Lasix 40 mg IV and will be switched to p.o. for cardiology recommendation Continue beta-blockers cardiology in 1 week upon discharge           Chronic atrial fibrillation Rate control with metoprolol no anticoagulation due to concerns of fall risk and patient wishes at this time Continue on ASA and beta blocker       Depression Continue home meds         Morbid obesity (BMI 42.2 kg/m2) Complicates overall prognosis and care       COPD with chronic respiratory failure Not acutely exacerbated Continue home meds  Discharge Diagnoses:  Principal Problem:   Acute respiratory failure (HCC) Active Problems:   Acute CHF (congestive heart failure) (HCC)   Atrial fibrillation (HCC)   Depression   Obesity, morbid, BMI 40.0-49.9 (HCC)   Chronic respiratory failure (HCC)   Acute on chronic diastolic CHF (congestive heart failure) (HCC)   Pressure injury of skin    Discharge Instructions  Discharge Instructions     Call MD for:  difficulty breathing, headache or visual disturbances   Complete by: As directed    Diet - low sodium heart healthy   Complete by: As directed    Discharge instructions   Complete by: As directed    Start lasix tomorrow. Eat a banana a day.  Need to f/u with pcp next week, need blood work  F/u with cardiology next week   Increase activity slowly   Complete by: As directed       Allergies as of 01/14/2021       Reactions   Codeine Other (See Comments)   Morphine And Related Other (See Comments)        Medication List     TAKE these medications    amLODipine 10 MG tablet Commonly known as: NORVASC Take 1 tablet (10 mg total) by mouth daily. Start taking on: January 15, 2021 What changed:  medication strength how much to take   aspirin 325 MG  tablet Take 325 mg by mouth daily.   atorvastatin 10 MG tablet Commonly known as: LIPITOR Take 10 mg by mouth daily.   Butalbital-APAP-Caffeine 50-325-40 MG capsule Take 1 capsule by mouth daily as needed for headache.   Cholecalciferol 25 MCG (1000 UT) capsule Take 1,000 Units by mouth daily.   citalopram 10 MG tablet Commonly known as: CELEXA Take 10 mg by mouth daily.   fexofenadine 180 MG tablet Commonly known as: ALLEGRA Take 60 mg by mouth daily.   finasteride 5 MG tablet Commonly known as: PROSCAR Take 5 mg by mouth daily.   Fish Oil 1000 MG Cpdr Take 1 capsule by mouth in the morning and at bedtime.   fluticasone 50 MCG/ACT nasal spray Commonly known as: FLONASE Place 1 spray into both nostrils daily.   furosemide 40 MG tablet Commonly known as: LASIX Take 1 tablet (40 mg total) by mouth daily. Start taking on: January 15, 2021 What changed: Another medication with the same name was removed. Continue taking this medication, and follow the directions you see here.   gabapentin 100 MG capsule Commonly known as: NEURONTIN Take 100 mg by mouth 2 (two) times daily.   Glucosamine Sulfate 1000 MG Caps Take 1 tablet by mouth 2 (two) times daily.   metoprolol tartrate 50 MG tablet Commonly known as: LOPRESSOR Take 50 mg by mouth 2 (two) times daily.   mirtazapine 15 MG tablet Commonly known as: REMERON Take 15 mg by mouth at bedtime.   Multi-Vitamin tablet Take 1 tablet by mouth daily.   psyllium 58.6 % packet Commonly known as: METAMUCIL Take 1 packet by mouth daily.        Follow-up Information     Lamar Blinks, MD Follow up in 1 week(s).   Specialty: Cardiology Contact information: [address] [facility] [place] [address] [phone]         Mick Sell, MD Follow up in 1 week(s).   Specialty: Infectious Diseases Why: needs lab Contact information: [address] [place]  [address] [phone]                Allergies  Allergen Reactions   Codeine Other (See Comments)   Morphine And Related Other (See Comments)    Consultations: Cardiology   Procedures/Studies: DG Chest 1 View  Result Date: 01/11/2021 CLINICAL DATA:  Shortness of breath. diff breathing; bleaky; 15L non rebreaher; rhonchi EXAM: CHEST  1 VIEW COMPARISON:  Chest x-ray 11/10/2020. FINDINGS: Persistent enlarged cardiac silhouette. The heart size and mediastinal contours are otherwise unchanged. Aortic calcification. Interval development of bilateral nodular lesions:a 6 mm right mid lung zone and a 51mm left mid lung zone noted. Increased interstitial markings. Small to moderate size left pleural effusion. No pneumothorax. No acute osseous abnormality. IMPRESSION: 1. Increased interstitial markings as well as bilateral pulmonary nodules. 2. Small to moderate volume  left pleural effusion. 3. Cardiomegaly. 4. Recommend CT chest with intravenous contrast for further evaluation. Electronically Signed   By: Tish Frederickson M.D.   On: 01/11/2021 06:49   CT Angio Chest PE W and/or Wo Contrast  Result Date: 01/11/2021 CLINICAL DATA:  Shortness of breath, cough. EXAM: CT ANGIOGRAPHY CHEST WITH CONTRAST TECHNIQUE: Multidetector CT imaging of the chest was performed using the standard protocol during bolus administration of intravenous contrast. Multiplanar CT image reconstructions and MIPs were obtained to evaluate the vascular anatomy. CONTRAST:  66mL OMNIPAQUE IOHEXOL 350 MG/ML SOLN COMPARISON:  None. FINDINGS: Cardiovascular: No filling defects in the pulmonary arteries to suggest pulmonary emboli. Cardiomegaly. Diffuse coronary artery calcifications and aortic calcifications throughout the aortic arch and descending thoracic aorta. No aneurysm. Mediastinum/Nodes: No mediastinal, hilar, or axillary adenopathy. Trachea and esophagus are unremarkable. Enlarged left thyroid lobe which extends into the  substernal region. Lungs/Pleura: Bilateral moderate to large pleural effusions, left greater than right. Compressive atelectasis in the lower lobes and in the lingula. Upper Abdomen: Imaging into the upper abdomen demonstrates no acute findings. Musculoskeletal: Chest wall soft tissues are unremarkable. No acute bony abnormality. Review of the MIP images confirms the above findings. IMPRESSION: No evidence of pulmonary embolus. Cardiomegaly, diffuse coronary artery disease. Moderate to large bilateral pleural effusions with compressive atelectasis, left greater than right. Enlarged left thyroid lobe with substernal extension. In the setting of significant comorbidities or limited life expectancy, no follow-up recommended (ref: J Am Coll Radiol. 2015 Feb;12(2): 143-50). Aortic Atherosclerosis (ICD10-I70.0). Electronically Signed   By: Charlett Nose M.D.   On: 01/11/2021 10:33   DG Chest Port 1 View  Result Date: 01/12/2021 CLINICAL DATA:  Status post left-sided thoracentesis EXAM: PORTABLE CHEST 1 VIEW COMPARISON:  01/11/2021 FINDINGS: Patient rotated left. Moderate cardiomegaly. Left-sided pleural effusion is moderate, similar. Small right pleural effusion is new or increased. Question loculation laterally. No pneumothorax. Interstitial prominence and indistinctness is moderate, progressive. Persistent left and developing right base airspace disease. IMPRESSION: No pneumothorax or other complication after thoracentesis. Cardiomegaly with congestive heart failure, increased. Persistent left and developing right pleural effusions. Bibasilar airspace disease which could represent atelectasis or infection. Electronically Signed   By: Jeronimo Greaves M.D.   On: 01/12/2021 17:33   ECHOCARDIOGRAM COMPLETE  Result Date: 01/11/2021    ECHOCARDIOGRAM REPORT   Patient Name:   DAVONTA STROOT Huether Date of Exam: 01/11/2021 Medical Rec #:  [id number]        Height:       67.0 in Accession #:    [id number]       Weight:       270.0 lb  Date of Birth:  [birth year]        BSA:          2.297 m Patient Age:    87 years         BP:           122/72 mmHg Patient Gender: M                HR:           67 bpm. Exam Location:  [facility] Procedure: 2D Echo, Cardiac Doppler and Color Doppler Indications:     CHF-acute diastolic I50.31  History:         Patient has no prior history of Echocardiogram examinations.                  COPD, Arrythmias:Atrial Fibrillation; Risk  Factors:Hypertension.  Sonographer:     Cristela BlueJerry Hege RDCS (AE) Referring Phys:  [id number] Lucile ShuttersCHUKWU AGBATA Diagnosing Phys: Arnoldo HookerBruce Kowalski MD  Sonographer Comments: Suboptimal apical window. IMPRESSIONS  1. Left ventricular ejection fraction, by estimation, is 50 to 55%. The left ventricle has low normal function. The left ventricle has no regional wall motion abnormalities. Left ventricular diastolic parameters were normal.  2. Right ventricular systolic function is normal. The right ventricular size is normal.  3. Left atrial size was mildly dilated.  4. The mitral valve is normal in structure. Mild to moderate mitral valve regurgitation.  5. The aortic valve is normal in structure. Aortic valve regurgitation is trivial. FINDINGS  Left Ventricle: Left ventricular ejection fraction, by estimation, is 50 to 55%. The left ventricle has low normal function. The left ventricle has no regional wall motion abnormalities. The left ventricular internal cavity size was normal in size. There is no left ventricular hypertrophy. Left ventricular diastolic parameters were normal. Right Ventricle: The right ventricular size is normal. No increase in right ventricular wall thickness. Right ventricular systolic function is normal. Left Atrium: Left atrial size was mildly dilated. Right Atrium: Right atrial size was normal in size. Pericardium: There is no evidence of pericardial effusion. Mitral Valve: The mitral valve is normal in structure. Mild to moderate mitral valve regurgitation. Tricuspid  Valve: The tricuspid valve is normal in structure. Tricuspid valve regurgitation is mild. Aortic Valve: The aortic valve is normal in structure. Aortic valve regurgitation is trivial. Aortic valve mean gradient measures 6.0 mmHg. Aortic valve peak gradient measures 11.1 mmHg. Aortic valve area, by VTI measures 1.80 cm. Pulmonic Valve: The pulmonic valve was normal in structure. Pulmonic valve regurgitation is trivial. Aorta: The aortic root and ascending aorta are structurally normal, with no evidence of dilitation. IAS/Shunts: No atrial level shunt detected by color flow Doppler.  LEFT VENTRICLE PLAX 2D LVIDd:         4.28 cm  Diastology LVIDs:         3.10 cm  LV e' medial:    6.96 cm/s LV PW:         1.86 cm  LV E/e' medial:  14.5 LV IVS:        1.58 cm  LV e' lateral:   8.16 cm/s LVOT diam:     2.00 cm  LV E/e' lateral: 12.4 LV SV:         58 LV SV Index:   25 LVOT Area:     3.14 cm  RIGHT VENTRICLE RV Basal diam:  3.32 cm LEFT ATRIUM            Index       RIGHT ATRIUM           Index LA diam:      5.40 cm  2.35 cm/m  RA Area:     33.80 cm LA Vol (A4C): 126.0 ml 54.85 ml/m RA Volume:   114.00 ml 49.62 ml/m  AORTIC VALVE                    PULMONIC VALVE AV Area (Vmax):    1.73 cm     PV Vmax:        0.78 m/s AV Area (Vmean):   1.74 cm     PV Peak grad:   2.4 mmHg AV Area (VTI):     1.80 cm     RVOT Peak grad: 4 mmHg AV Vmax:  166.33 cm/s AV Vmean:          113.667 cm/s AV VTI:            0.323 m AV Peak Grad:      11.1 mmHg AV Mean Grad:      6.0 mmHg LVOT Vmax:         91.40 cm/s LVOT Vmean:        63.000 cm/s LVOT VTI:          0.185 m LVOT/AV VTI ratio: 0.57  AORTA Ao Root diam: 3.10 cm MITRAL VALVE                TRICUSPID VALVE MV Area (PHT): 6.65 cm     TR Peak grad:   25.0 mmHg MV Decel Time: 114 msec     TR Vmax:        250.00 cm/s MV E velocity: 101.00 cm/s MV A velocity: 54.80 cm/s   SHUNTS MV E/A ratio:  1.84         Systemic VTI:  0.18 m                             Systemic Diam:  2.00 cm Arnoldo Hooker MD Electronically signed by Arnoldo Hooker MD Signature Date/Time: 01/11/2021/5:19:32 PM    Final    US THORACENTESIS ASP PLEURAL SPACE W/IMG GUIDE  Result Date: 01/12/2021 INDICATION: Bilateral pleural effusions. EXAM: ULTRASOUND GUIDED LEFT THORACENTESIS MEDICATIONS: None. COMPLICATIONS: None immediate. PROCEDURE: An ultrasound guided thoracentesis was thoroughly discussed with the patient and questions answered. The benefits, risks, alternatives and complications were also discussed. The patient understands and wishes to proceed with the procedure. Written consent was obtained. Ultrasound was performed to localize and mark an adequate pocket of fluid in the left chest. The area was then prepped and draped in the normal sterile fashion. 1% Lidocaine was used for local anesthesia. Under continuous ultrasound guidance a 19 gauge, 7-cm, Yueh catheter was introduced. Thoracentesis was performed. The catheter was removed and a dressing applied. FINDINGS: A total of approximately 1.2 L of clear straw-colored fluid was removed. Samples were sent to the laboratory as requested by the clinical team. IMPRESSION: Successful ultrasound guided left thoracentesis yielding 1.2 L of clear straw-colored of pleural fluid. Ultrasound evaluation of the right chest demonstrates small amount of fluid which was not amenable to aspiration. Electronically Signed   By: Acquanetta Belling M.D.   On: 01/12/2021 16:58      Subjective: Feels much better today.  Discharge Exam: Vitals:   01/14/21 0810 01/14/21 0907  BP: (!) 168/75   Pulse: 68 74  Resp: 18   Temp: 98.5 F (36.9 C)   SpO2: 92%    Vitals:   01/14/21 0022 01/14/21 0412 01/14/21 0810 01/14/21 0907  BP: 123/63 (!) 152/88 (!) 168/75   Pulse: (!) 55 62 68 74  Resp: Temp: 98 F (36.7 C) 98.8 F (37.1 C) 98.5 F (36.9 C)   TempSrc: Oral Oral Oral   SpO2: 95% 94% 92%   Weight:  84.5 kg    Height:        General: Pt is alert,  awake, not in acute distress Cardiovascular: RRR, S1/S2 +, no rubs, no gallops Respiratory: CTA bilaterally, no wheezing, no rhonchi Abdominal: Soft, NT, ND, bowel sounds + Extremities: no edema    The results of significant diagnostics from this hospitalization (including imaging, microbiology, ancillary and laboratory)  are listed below for reference.     Microbiology: Recent Results (from the past 240 hour(s))  Resp Panel by RT-PCR (Flu A&B, Covid) Nasopharyngeal Swab     Status: None   Collection Time: 01/11/21  6:27 AM   Specimen: Nasopharyngeal Swab; Nasopharyngeal(NP) swabs in vial transport medium  Result Value Ref Range Status   SARS Coronavirus 2 by RT PCR NEGATIVE NEGATIVE Final    Comment: (NOTE) SARS-CoV-2 target nucleic acids are NOT DETECTED.  The SARS-CoV-2 RNA is generally detectable in upper respiratory specimens during the acute phase of infection. The lowest concentration of SARS-CoV-2 viral copies this assay can detect is 138 copies/mL. A negative result does not preclude SARS-Cov-2 infection and should not be used as the sole basis for treatment or other patient management decisions. A negative result may occur with  improper specimen collection/handling, submission of specimen other than nasopharyngeal swab, presence of viral mutation(s) within the areas targeted by this assay, and inadequate number of viral copies(<138 copies/mL). A negative result must be combined with clinical observations, patient history, and epidemiological information. The expected result is Negative.  Fact Sheet for Patients:  BloggerCourse.com  Fact Sheet for Healthcare Providers:  SeriousBroker.it  This test is no t yet approved or cleared by the [place] FDA and  has been authorized for detection and/or diagnosis of SARS-CoV-2 by FDA under an Emergency Use Authorization (EUA). This EUA will remain  in effect (meaning this  test can be used) for the duration of the COVID-19 declaration under Section 564(b)(1) of the Act, 21 U.S.C.section 360bbb-3(b)(1), unless the authorization is terminated  or revoked sooner.       Influenza A by PCR NEGATIVE NEGATIVE Final   Influenza B by PCR NEGATIVE NEGATIVE Final    Comment: (NOTE) The Xpert Xpress SARS-CoV-2/FLU/RSV plus assay is intended as an aid in the diagnosis of influenza from Nasopharyngeal swab specimens and should not be used as a sole basis for treatment. Nasal washings and aspirates are unacceptable for Xpert Xpress SARS-CoV-2/FLU/RSV testing.  Fact Sheet for Patients: BloggerCourse.com  Fact Sheet for Healthcare Providers: SeriousBroker.it  This test is not yet approved or cleared by the [place] FDA and has been authorized for detection and/or diagnosis of SARS-CoV-2 by FDA under an Emergency Use Authorization (EUA). This EUA will remain in effect (meaning this test can be used) for the duration of the COVID-19 declaration under Section 564(b)(1) of the Act, 21 U.S.C. section 360bbb-3(b)(1), unless the authorization is terminated or revoked.  Performed at [facility], [address] [place], [place] [address]   Blood culture (single)     Status: None (Preliminary result)   Collection Time: 01/11/21  9:16 AM   Specimen: BLOOD  Result Value Ref Range Status   Specimen Description BLOOD BLOOD LEFT WRIST  Final   Special Requests   Final    BOTTLES DRAWN AEROBIC AND ANAEROBIC Blood Culture adequate volume   Culture   Final    NO GROWTH 2 DAYS Performed at [facility], [address] [place], [place] [address]    Report Status PENDING  Incomplete     Labs: BNP (last 3 results) Recent Labs    11/10/20 1120 01/11/21 0627 01/13/21 0536  BNP 599.6* 628.1* 360.4*   Basic Metabolic Panel: Recent Labs  Lab 01/11/21 0627 01/12/21 0708 01/13/21 0536  01/14/21 0539  NA 138 135 137 135  K 3.5 5.0 4.0 3.7  CL 92* 93* 92* 90*  CO2 34* 34* 39*  38*  GLUCOSE 132* 111* 95 96  BUN 10 13 15 13   CREATININE 0.94 0.89 0.86 0.81  CALCIUM 9.0 8.8* 8.9 8.5*   Liver Function Tests: Recent Labs  Lab 01/11/21 0627  AST 21  ALT 10  ALKPHOS 69  BILITOT 1.0  PROT 7.5  ALBUMIN 3.8   No results for input(s): LIPASE, AMYLASE in the last 168 hours. No results for input(s): AMMONIA in the last 168 hours. CBC: Recent Labs  Lab 01/11/21 0627 01/14/21 0539  WBC 11.4* 8.5  NEUTROABS 5.6  --   HGB 14.3 11.7*  HCT 44.5 37.4*  MCV 96.7 97.9  PLT 208 175   Cardiac Enzymes: No results for input(s): CKTOTAL, CKMB, CKMBINDEX, TROPONINI in the last 168 hours. BNP: Invalid input(s): POCBNP CBG: No results for input(s): GLUCAP in the last 168 hours. D-Dimer No results for input(s): DDIMER in the last 72 hours. Hgb A1c No results for input(s): HGBA1C in the last 72 hours. Lipid Profile No results for input(s): CHOL, HDL, LDLCALC, TRIG, CHOLHDL, LDLDIRECT in the last 72 hours. Thyroid function studies No results for input(s): TSH, T4TOTAL, T3FREE, THYROIDAB in the last 72 hours.  Invalid input(s): FREET3 Anemia work up No results for input(s): VITAMINB12, FOLATE, FERRITIN, TIBC, IRON, RETICCTPCT in the last 72 hours. Urinalysis    Component Value Date/Time   COLORURINE YELLOW (A) 01/17/2016 1945   APPEARANCEUR CLEAR (A) 01/17/2016 1945   LABSPEC 1.009 01/17/2016 1945   PHURINE 6.0 01/17/2016 1945   GLUCOSEU NEGATIVE 01/17/2016 1945   HGBUR NEGATIVE 01/17/2016 1945   BILIRUBINUR NEGATIVE 01/17/2016 1945   KETONESUR NEGATIVE 01/17/2016 1945   PROTEINUR NEGATIVE 01/17/2016 1945   NITRITE NEGATIVE 01/17/2016 1945   LEUKOCYTESUR NEGATIVE 01/17/2016 1945   Sepsis Labs Invalid input(s): PROCALCITONIN,  WBC,  LACTICIDVEN Microbiology Recent Results (from the past 240 hour(s))  Resp Panel by RT-PCR (Flu A&B, Covid) Nasopharyngeal Swab      Status: None   Collection Time: 01/11/21  6:27 AM   Specimen: Nasopharyngeal Swab; Nasopharyngeal(NP) swabs in vial transport medium  Result Value Ref Range Status   SARS Coronavirus 2 by RT PCR NEGATIVE NEGATIVE Final    Comment: (NOTE) SARS-CoV-2 target nucleic acids are NOT DETECTED.  The SARS-CoV-2 RNA is generally detectable in upper respiratory specimens during the acute phase of infection. The lowest concentration of SARS-CoV-2 viral copies this assay can detect is 138 copies/mL. A negative result does not preclude SARS-Cov-2 infection and should not be used as the sole basis for treatment or other patient management decisions. A negative result may occur with  improper specimen collection/handling, submission of specimen other than nasopharyngeal swab, presence of viral mutation(s) within the areas targeted by this assay, and inadequate number of viral copies(<138 copies/mL). A negative result must be combined with clinical observations, patient history, and epidemiological information. The expected result is Negative.  Fact Sheet for Patients:  03/13/21  Fact Sheet for Healthcare Providers:  BloggerCourse.com  This test is no t yet approved or cleared by the [place] FDA and  has been authorized for detection and/or diagnosis of SARS-CoV-2 by FDA under an Emergency Use Authorization (EUA). This EUA will remain  in effect (meaning this test can be used) for the duration of the COVID-19 declaration under Section 564(b)(1) of the Act, 21 U.S.C.section 360bbb-3(b)(1), unless the authorization is terminated  or revoked sooner.       Influenza A by PCR NEGATIVE NEGATIVE Final   Influenza B by PCR NEGATIVE NEGATIVE Final  Comment: (NOTE) The Xpert Xpress SARS-CoV-2/FLU/RSV plus assay is intended as an aid in the diagnosis of influenza from Nasopharyngeal swab specimens and should not be used as a sole basis  for treatment. Nasal washings and aspirates are unacceptable for Xpert Xpress SARS-CoV-2/FLU/RSV testing.  Fact Sheet for Patients: BloggerCourse.com  Fact Sheet for Healthcare Providers: SeriousBroker.it  This test is not yet approved or cleared by the [place] FDA and has been authorized for detection and/or diagnosis of SARS-CoV-2 by FDA under an Emergency Use Authorization (EUA). This EUA will remain in effect (meaning this test can be used) for the duration of the COVID-19 declaration under Section 564(b)(1) of the Act, 21 U.S.C. section 360bbb-3(b)(1), unless the authorization is terminated or revoked.  Performed at [facility], [address] [place], [place] [address]   Blood culture (single)     Status: None (Preliminary result)   Collection Time: 01/11/21  9:16 AM   Specimen: BLOOD  Result Value Ref Range Status   Specimen Description BLOOD BLOOD LEFT WRIST  Final   Special Requests   Final    BOTTLES DRAWN AEROBIC AND ANAEROBIC Blood Culture adequate volume   Culture   Final    NO GROWTH 2 DAYS Performed at [facility], [address] [place], [place] [address]    Report Status PENDING  Incomplete     Time coordinating discharge: Over 30 minutes  SIGNED:   Lynn Ito, MD  [facility] 01/14/2021, 10:46 AM Pager   If 7PM-7AM, please contact night-coverage [email or web address] Password [email or web address]

## 2021-01-14 NOTE — TOC Transition Note (Addendum)
Transition of Care [facility]) - CM/SW Discharge Note   Patient Details  Name: Brian Lara MRN: [id number] Date of Birth: [birth year]  Transition of [facility]) CM/SW Contact:  Bing Quarry, RN Phone Number: 01/14/2021, 12:39 PM   Clinical Narrative:  Patient discharging Home/Home Health PT/OT. DME 3:1 and Rolling Walker ordered via [employer]. [facility] contacted and referral intake given to weekend answering service. Pending approval. Gabriel Cirri RN CM    [facility] has accepted via [facility]. Patient's son is not wanting to wait on DME even though it is already on the way to be delivered. Gabriel Cirri RN CM   Final next level of care: Home w Home Health Services Barriers to Discharge: Barriers Resolved   Patient Goals and CMS Choice Patient states their goals for this hospitalization and ongoing recovery are:: to go home CMS Medicare.gov Compare Post Acute Care list provided to:: Patient Choice offered to / list presented to : Patient  Discharge Placement                       Discharge Plan and Services                DME Arranged: Walker rolling with seat, 3-N-1 DME Agency: [employer] Date DME Agency Contacted: 01/14/21 Time DME Agency Contacted: 1237 Representative spoke with at DME Agency: [facility] Arranged: PT, OT HH Agency: [facility] Date [facility] Agency Contacted: 01/14/21 Time HH Agency Contacted: 1238 Representative spoke with at [facility] Agency: Answering Service Intake/Referral Call Center [phone]; Pending  Social Determinants of Health (SDOH) Interventions     Readmission Risk Interventions No flowsheet data found.

## 2021-01-14 NOTE — Progress Notes (Signed)
Pt and son made aware of discharge/agreeable. Discharge instructions explained/both verbalized understanding. IV and tele removed. Will transport off unit via wheelchair.

## 2021-01-14 NOTE — Progress Notes (Signed)
Son and granddaughter are primary caregivers for pt/refuse to wait for equipment to be delivered that was recommended by PT/OT stating they do not need it. TOC made aware.

## 2021-01-16 LAB — CYTOLOGY - NON PAP

## 2021-01-16 LAB — CULTURE, BLOOD (SINGLE)
Culture: NO GROWTH
Special Requests: ADEQUATE

## 2021-01-16 LAB — PROTEIN, BODY FLUID (OTHER): Total Protein, Body Fluid Other: 2.2 g/dL

## 2021-01-31 ENCOUNTER — Ambulatory Visit: Payer: Medicare Other | Admitting: Family

## 2021-02-21 ENCOUNTER — Other Ambulatory Visit: Payer: Self-pay | Admitting: Infectious Diseases

## 2021-02-21 DIAGNOSIS — J90 Pleural effusion, not elsewhere classified: Secondary | ICD-10-CM

## 2021-02-24 ENCOUNTER — Inpatient Hospital Stay: Payer: Medicare Other

## 2021-02-24 ENCOUNTER — Emergency Department: Payer: Medicare Other

## 2021-02-24 ENCOUNTER — Other Ambulatory Visit: Payer: Self-pay

## 2021-02-24 ENCOUNTER — Encounter: Payer: Self-pay | Admitting: Internal Medicine

## 2021-02-24 ENCOUNTER — Inpatient Hospital Stay
Admission: EM | Admit: 2021-02-24 | Discharge: 2021-03-03 | DRG: 291 | Disposition: A | Discharge status: Hospice | Payer: Medicare Other | Source: Skilled Nursing Facility | Attending: Internal Medicine | Admitting: Internal Medicine

## 2021-02-24 DIAGNOSIS — Z9049 Acquired absence of other specified parts of digestive tract: Secondary | ICD-10-CM | POA: Diagnosis not present

## 2021-02-24 DIAGNOSIS — H9190 Unspecified hearing loss, unspecified ear: Secondary | ICD-10-CM | POA: Diagnosis present

## 2021-02-24 DIAGNOSIS — Z7982 Long term (current) use of aspirin: Secondary | ICD-10-CM

## 2021-02-24 DIAGNOSIS — I5033 Acute on chronic diastolic (congestive) heart failure: Secondary | ICD-10-CM | POA: Diagnosis present

## 2021-02-24 DIAGNOSIS — I452 Bifascicular block: Secondary | ICD-10-CM | POA: Diagnosis present

## 2021-02-24 DIAGNOSIS — J9811 Atelectasis: Secondary | ICD-10-CM | POA: Diagnosis present

## 2021-02-24 DIAGNOSIS — I509 Heart failure, unspecified: Secondary | ICD-10-CM

## 2021-02-24 DIAGNOSIS — N183 Chronic kidney disease, stage 3 unspecified: Secondary | ICD-10-CM | POA: Diagnosis present

## 2021-02-24 DIAGNOSIS — Z96653 Presence of artificial knee joint, bilateral: Secondary | ICD-10-CM | POA: Diagnosis present

## 2021-02-24 DIAGNOSIS — J9621 Acute and chronic respiratory failure with hypoxia: Secondary | ICD-10-CM | POA: Diagnosis present

## 2021-02-24 DIAGNOSIS — E669 Obesity, unspecified: Secondary | ICD-10-CM | POA: Diagnosis present

## 2021-02-24 DIAGNOSIS — Z79899 Other long term (current) drug therapy: Secondary | ICD-10-CM | POA: Diagnosis not present

## 2021-02-24 DIAGNOSIS — I482 Chronic atrial fibrillation, unspecified: Secondary | ICD-10-CM | POA: Diagnosis present

## 2021-02-24 DIAGNOSIS — Z7189 Other specified counseling: Secondary | ICD-10-CM | POA: Diagnosis not present

## 2021-02-24 DIAGNOSIS — Z9981 Dependence on supplemental oxygen: Secondary | ICD-10-CM

## 2021-02-24 DIAGNOSIS — Z20822 Contact with and (suspected) exposure to covid-19: Secondary | ICD-10-CM | POA: Diagnosis present

## 2021-02-24 DIAGNOSIS — Z6828 Body mass index (BMI) 28.0-28.9, adult: Secondary | ICD-10-CM

## 2021-02-24 DIAGNOSIS — F32A Depression, unspecified: Secondary | ICD-10-CM | POA: Diagnosis present

## 2021-02-24 DIAGNOSIS — E876 Hypokalemia: Secondary | ICD-10-CM | POA: Diagnosis present

## 2021-02-24 DIAGNOSIS — E785 Hyperlipidemia, unspecified: Secondary | ICD-10-CM | POA: Diagnosis present

## 2021-02-24 DIAGNOSIS — J432 Centrilobular emphysema: Secondary | ICD-10-CM | POA: Diagnosis present

## 2021-02-24 DIAGNOSIS — R0602 Shortness of breath: Secondary | ICD-10-CM | POA: Diagnosis not present

## 2021-02-24 DIAGNOSIS — Z885 Allergy status to narcotic agent status: Secondary | ICD-10-CM | POA: Diagnosis not present

## 2021-02-24 DIAGNOSIS — J90 Pleural effusion, not elsewhere classified: Secondary | ICD-10-CM

## 2021-02-24 DIAGNOSIS — N400 Enlarged prostate without lower urinary tract symptoms: Secondary | ICD-10-CM | POA: Diagnosis present

## 2021-02-24 DIAGNOSIS — Z8249 Family history of ischemic heart disease and other diseases of the circulatory system: Secondary | ICD-10-CM | POA: Diagnosis not present

## 2021-02-24 DIAGNOSIS — I130 Hypertensive heart and chronic kidney disease with heart failure and stage 1 through stage 4 chronic kidney disease, or unspecified chronic kidney disease: Principal | ICD-10-CM | POA: Diagnosis present

## 2021-02-24 DIAGNOSIS — J9610 Chronic respiratory failure, unspecified whether with hypoxia or hypercapnia: Secondary | ICD-10-CM | POA: Diagnosis present

## 2021-02-24 DIAGNOSIS — Z515 Encounter for palliative care: Secondary | ICD-10-CM

## 2021-02-24 DIAGNOSIS — I10 Essential (primary) hypertension: Secondary | ICD-10-CM | POA: Diagnosis present

## 2021-02-24 DIAGNOSIS — I4891 Unspecified atrial fibrillation: Secondary | ICD-10-CM | POA: Diagnosis present

## 2021-02-24 DIAGNOSIS — Z66 Do not resuscitate: Secondary | ICD-10-CM | POA: Diagnosis present

## 2021-02-24 DIAGNOSIS — N1831 Chronic kidney disease, stage 3a: Secondary | ICD-10-CM | POA: Diagnosis present

## 2021-02-24 DIAGNOSIS — I4821 Permanent atrial fibrillation: Secondary | ICD-10-CM | POA: Diagnosis not present

## 2021-02-24 LAB — CBC WITH DIFFERENTIAL/PLATELET
Abs Immature Granulocytes: 0.03 10*3/uL (ref 0.00–0.07)
Basophils Absolute: 0.1 10*3/uL (ref 0.0–0.1)
Basophils Relative: 1 %
Eosinophils Absolute: 0.2 10*3/uL (ref 0.0–0.5)
Eosinophils Relative: 2 %
HCT: 39.7 % (ref 39.0–52.0)
Hemoglobin: 12.4 g/dL — ABNORMAL LOW (ref 13.0–17.0)
Immature Granulocytes: 0 %
Lymphocytes Relative: 27 %
Lymphs Abs: 2.5 10*3/uL (ref 0.7–4.0)
MCH: 30.8 pg (ref 26.0–34.0)
MCHC: 31.2 g/dL (ref 30.0–36.0)
MCV: 98.5 fL (ref 80.0–100.0)
Monocytes Absolute: 0.9 10*3/uL (ref 0.1–1.0)
Monocytes Relative: 9 %
Neutro Abs: 5.9 10*3/uL (ref 1.7–7.7)
Neutrophils Relative %: 61 %
Platelets: 189 10*3/uL (ref 150–400)
RBC: 4.03 MIL/uL — ABNORMAL LOW (ref 4.22–5.81)
RDW: 13.6 % (ref 11.5–15.5)
WBC: 9.6 10*3/uL (ref 4.0–10.5)
nRBC: 0 % (ref 0.0–0.2)

## 2021-02-24 LAB — COMPREHENSIVE METABOLIC PANEL
ALT: 14 U/L (ref 0–44)
AST: 22 U/L (ref 15–41)
Albumin: 3.4 g/dL — ABNORMAL LOW (ref 3.5–5.0)
Alkaline Phosphatase: 81 U/L (ref 38–126)
Anion gap: 13 (ref 5–15)
BUN: 11 mg/dL (ref 8–23)
CO2: 37 mmol/L — ABNORMAL HIGH (ref 22–32)
Calcium: 9 mg/dL (ref 8.9–10.3)
Chloride: 88 mmol/L — ABNORMAL LOW (ref 98–111)
Creatinine, Ser: 0.81 mg/dL (ref 0.61–1.24)
GFR, Estimated: >60 mL/min (ref >60)
Glucose, Bld: 125 mg/dL — ABNORMAL HIGH (ref 70–99)
Potassium: 3.5 mmol/L (ref 3.5–5.1)
Sodium: 138 mmol/L (ref 135–145)
Total Bilirubin: 1 mg/dL (ref 0.3–1.2)
Total Protein: 7.2 g/dL (ref 6.5–8.1)

## 2021-02-24 LAB — BRAIN NATRIURETIC PEPTIDE: B Natriuretic Peptide: 739.8 pg/mL — ABNORMAL HIGH (ref 0.0–100.0)

## 2021-02-24 LAB — RESP PANEL BY RT-PCR (FLU A&B, COVID) ARPGX2
Influenza A by PCR: NEGATIVE
Influenza B by PCR: NEGATIVE
SARS Coronavirus 2 by RT PCR: NEGATIVE

## 2021-02-24 LAB — TROPONIN I (HIGH SENSITIVITY)
Troponin I (High Sensitivity): 18 ng/L — ABNORMAL HIGH (ref <18)
Troponin I (High Sensitivity): 17 ng/L (ref <18)

## 2021-02-24 LAB — PROTIME-INR
INR: 1.1 (ref 0.8–1.2)
Prothrombin Time: 14.1 seconds (ref 11.4–15.2)

## 2021-02-24 MED ORDER — BUTALBITAL-APAP-CAFFEINE 50-325-40 MG PO TABS: 1.0000 | ORAL_TABLET | Freq: Every day | ORAL | Status: DC | PRN

## 2021-02-24 MED ORDER — LORATADINE 10 MG PO TABS
10.0000 mg | ORAL_TABLET | Freq: Every day | ORAL | Status: DC
  Administered 2021-02-25 – 2021-02-28 (×4): 10 mg via ORAL
  Filled 2021-02-24 (×4): qty 1

## 2021-02-24 MED ORDER — FUROSEMIDE 10 MG/ML IJ SOLN
40.0000 mg | Freq: Once | INTRAMUSCULAR | Status: AC
  Administered 2021-02-24: 40 mg via INTRAVENOUS
  Filled 2021-02-24: qty 4

## 2021-02-24 MED ORDER — SODIUM CHLORIDE 0.9% FLUSH: 3.0000 mL | INTRAVENOUS | Status: DC | PRN

## 2021-02-24 MED ORDER — GABAPENTIN 100 MG PO CAPS
100.0000 mg | ORAL_CAPSULE | Freq: Two times a day (BID) | ORAL | Status: DC
  Administered 2021-02-24 – 2021-02-28 (×9): 100 mg via ORAL
  Filled 2021-02-24 (×9): qty 1

## 2021-02-24 MED ORDER — ACETAMINOPHEN 325 MG PO TABS: 650.0000 mg | ORAL_TABLET | ORAL | Status: DC | PRN

## 2021-02-24 MED ORDER — VITAMIN D 25 MCG (1000 UNIT) PO TABS
1000.0000 [IU] | ORAL_TABLET | Freq: Every day | ORAL | Status: DC
  Administered 2021-02-25 – 2021-02-28 (×4): 1000 [IU] via ORAL
  Filled 2021-02-24 (×4): qty 1

## 2021-02-24 MED ORDER — SODIUM CHLORIDE 0.9 % IV SOLN: 250.0000 mL | INTRAVENOUS | Status: DC | PRN

## 2021-02-24 MED ORDER — CITALOPRAM HYDROBROMIDE 20 MG PO TABS
10.0000 mg | ORAL_TABLET | Freq: Every day | ORAL | Status: DC
  Administered 2021-02-25 – 2021-02-28 (×4): 10 mg via ORAL
  Filled 2021-02-24 (×4): qty 1

## 2021-02-24 MED ORDER — AMLODIPINE BESYLATE 10 MG PO TABS
10.0000 mg | ORAL_TABLET | Freq: Every day | ORAL | Status: DC
  Administered 2021-02-25 – 2021-02-28 (×4): 10 mg via ORAL
  Filled 2021-02-24 (×4): qty 1

## 2021-02-24 MED ORDER — SODIUM CHLORIDE 0.9% FLUSH
3.0000 mL | Freq: Two times a day (BID) | INTRAVENOUS | Status: DC
  Administered 2021-02-24 – 2021-03-01 (×10): 3 mL via INTRAVENOUS

## 2021-02-24 MED ORDER — ADULT MULTIVITAMIN W/MINERALS CH
1.0000 | ORAL_TABLET | Freq: Every day | ORAL | Status: DC
  Administered 2021-02-25 – 2021-02-28 (×4): 1 via ORAL
  Filled 2021-02-24 (×4): qty 1

## 2021-02-24 MED ORDER — FLUTICASONE PROPIONATE 50 MCG/ACT NA SUSP
1.0000 | Freq: Every day | NASAL | Status: DC
  Administered 2021-02-26: 1 via NASAL
  Filled 2021-02-24 (×2): qty 16

## 2021-02-24 MED ORDER — ONDANSETRON HCL 4 MG/2ML IJ SOLN: 4.0000 mg | Freq: Four times a day (QID) | INTRAMUSCULAR | Status: DC | PRN

## 2021-02-24 MED ORDER — MIRTAZAPINE 15 MG PO TABS
15.0000 mg | ORAL_TABLET | Freq: Every day | ORAL | Status: DC
  Administered 2021-02-24 – 2021-02-27 (×4): 15 mg via ORAL
  Filled 2021-02-24 (×4): qty 1

## 2021-02-24 MED ORDER — ASPIRIN EC 325 MG PO TBEC
325.0000 mg | DELAYED_RELEASE_TABLET | Freq: Every day | ORAL | Status: DC
  Filled 2021-02-24: qty 1

## 2021-02-24 MED ORDER — ENOXAPARIN SODIUM 40 MG/0.4ML IJ SOSY
40.0000 mg | PREFILLED_SYRINGE | INTRAMUSCULAR | Status: DC
  Administered 2021-02-24 – 2021-02-27 (×4): 40 mg via SUBCUTANEOUS
  Filled 2021-02-24 (×4): qty 0.4

## 2021-02-24 MED ORDER — FUROSEMIDE 10 MG/ML IJ SOLN
40.0000 mg | Freq: Every day | INTRAMUSCULAR | Status: DC
  Administered 2021-02-25: 40 mg via INTRAVENOUS
  Filled 2021-02-24: qty 4

## 2021-02-24 MED ORDER — PSYLLIUM 95 % PO PACK
1.0000 | PACK | Freq: Every day | ORAL | Status: DC
  Administered 2021-02-26 – 2021-02-28 (×3): 1 via ORAL
  Filled 2021-02-24 (×5): qty 1

## 2021-02-24 MED ORDER — FINASTERIDE 5 MG PO TABS
5.0000 mg | ORAL_TABLET | Freq: Every day | ORAL | Status: DC
  Administered 2021-02-25 – 2021-02-28 (×4): 5 mg via ORAL
  Filled 2021-02-24 (×5): qty 1

## 2021-02-24 MED ORDER — METOPROLOL TARTRATE 50 MG PO TABS
50.0000 mg | ORAL_TABLET | Freq: Two times a day (BID) | ORAL | Status: DC
  Administered 2021-02-24 – 2021-02-28 (×7): 50 mg via ORAL
  Filled 2021-02-24 (×8): qty 1

## 2021-02-24 MED ORDER — OMEGA-3-ACID ETHYL ESTERS 1 G PO CAPS
1.0000 g | ORAL_CAPSULE | Freq: Two times a day (BID) | ORAL | Status: DC
Start: 2021-02-24 — End: 2021-02-28
  Administered 2021-02-24 – 2021-02-28 (×8): 1 g via ORAL
  Filled 2021-02-24 (×9): qty 1

## 2021-02-24 NOTE — ED Provider Notes (Signed)
[facility] Emergency Department Provider Note  ____________________________________________   Event Date/Time   First MD Initiated Contact with Patient 02/24/21 0740     (approximate)  I have reviewed the triage vital signs and the nursing notes.   HISTORY  Chief Complaint Shortness of Breath    HPI Brian Lara is a 87 y.o. male   with extensive past medical history including COPD, CHF, recurrent left-sided thoracenteses, here with shortness of breath.  The patient is currently scheduled to get a therapeutic thoracentesis of the left side this week.  He states he has had progressively worsening shortness of breath and dry cough for the last week or so.  Over the last 24 hours, the shortness of breath is worsened.  He lives alone and had extreme difficulty even getting around the house today.  He has had associated cough but no sputum production.  No fevers or chills.  He feels short of breath even at rest now.  He has had some mild lower extremity edema as well.  He has been taking medications as prescribed.  No chest pain.  Symptoms improved slightly when lying on his left.  No other alleviating factors.       Past Medical History:  Diagnosis Date   Atrial fibrillation (HCC)    COPD (chronic obstructive pulmonary disease) (HCC)    Hypercholesteremia    Hypertension     Patient Active Problem List   Diagnosis Date Noted   Pressure injury of skin 01/12/2021   Acute respiratory failure (HCC) 01/11/2021   Obesity, morbid, BMI 40.0-49.9 (HCC) 01/11/2021   Chronic respiratory failure (HCC) 01/11/2021   Acute on chronic diastolic CHF (congestive heart failure) (HCC) 01/11/2021   Acute CHF (congestive heart failure) (HCC) 11/10/2020   COPD exacerbation (HCC) 11/10/2020   Hypertension    Hypercholesteremia    Atrial fibrillation (HCC)    Acute respiratory failure with hypoxia (HCC)    Depression    CKD (chronic kidney disease), stage IIIa      Past Surgical History:  Procedure Laterality Date   CHOLECYSTECTOMY     REPLACEMENT TOTAL KNEE Bilateral     Prior to Admission medications   Medication Sig Start Date End Date Taking? Authorizing Provider  amLODipine (NORVASC) 10 MG tablet Take 1 tablet (10 mg total) by mouth daily. 01/15/21 02/14/21  Lynn Ito, MD  aspirin 325 MG tablet Take 325 mg by mouth daily.    [provider]  atorvastatin (LIPITOR) 10 MG tablet Take 10 mg by mouth daily.    [provider]  Butalbital-APAP-Caffeine [phone] MG capsule Take 1 capsule by mouth daily as needed for headache. 08/17/19   [provider]  Cholecalciferol 25 MCG (1000 UT) capsule Take 1,000 Units by mouth daily.    [provider]  citalopram (CELEXA) 10 MG tablet Take 10 mg by mouth daily.    [provider]  fexofenadine (ALLEGRA) 180 MG tablet Take 60 mg by mouth daily.    [provider]  finasteride (PROSCAR) 5 MG tablet Take 5 mg by mouth daily.    [provider]  fluticasone (FLONASE) 50 MCG/ACT nasal spray Place 1 spray into both nostrils daily.    [provider]  furosemide (LASIX) 40 MG tablet Take 1 tablet (40 mg total) by mouth daily. 01/15/21 02/14/21  Lynn Ito, MD  gabapentin (NEURONTIN) 100 MG capsule Take 100 mg by mouth 2 (two) times daily. 09/12/20   [provider]  Glucosamine  Sulfate 1000 MG CAPS Take 1 tablet by mouth 2 (two) times daily.    [provider]  metoprolol tartrate (LOPRESSOR) 50 MG tablet Take 50 mg by mouth 2 (two) times daily. 08/17/20   [provider]  mirtazapine (REMERON) 15 MG tablet Take 15 mg by mouth at bedtime. 11/02/20   [provider]  Multiple Vitamin (MULTI-VITAMIN) tablet Take 1 tablet by mouth daily.    [provider]  Omega-3 Fatty Acids (FISH OIL) 1000 MG CPDR Take 1 capsule by mouth in the morning and at bedtime.    [provider]  psyllium (METAMUCIL)  58.6 % packet Take 1 packet by mouth daily.    [provider]    Allergies Codeine and Morphine and related  Family History  Problem Relation Age of Onset   Hypertension Mother     Social History Social History   Tobacco Use   Smoking status: Never   Smokeless tobacco: Never  Substance Use Topics   Alcohol use: Never   Drug use: Not Currently    Review of Systems  Review of Systems  Constitutional:  Positive for fatigue. Negative for chills and fever.  HENT:  Negative for sore throat.   Respiratory:  Positive for cough and shortness of breath.   Cardiovascular:  Positive for leg swelling. Negative for chest pain.  Gastrointestinal:  Negative for abdominal pain.  Genitourinary:  Negative for flank pain.  Musculoskeletal:  Negative for neck pain.  Skin:  Negative for rash and wound.  Allergic/Immunologic: Negative for immunocompromised state.  Neurological:  Positive for weakness. Negative for numbness.  Hematological:  Does not bruise/bleed easily.  All other systems reviewed and are negative.   ____________________________________________  PHYSICAL EXAM:      VITAL SIGNS: ED Triage Vitals  Enc Vitals Group     BP 02/24/21 0748 (!) 167/91     Pulse Rate 02/24/21 0748 (!) 56     Resp 02/24/21 0748 (!) 26     Temp 02/24/21 0748 98 F (36.7 C)     Temp Source 02/24/21 0748 Oral     SpO2 02/24/21 0748 95 %     Weight --      Height --      Head Circumference --      Peak Flow --      Pain Score 02/24/21 0749 0     Pain Loc --      Pain Edu? --      Excl. in GC? --      Physical Exam Vitals and nursing note reviewed.  Constitutional:      General: He is not in acute distress.    Appearance: He is well-developed.  HENT:     Head: Normocephalic and atraumatic.  Eyes:     Conjunctiva/sclera: Conjunctivae normal.  Cardiovascular:     Rate and Rhythm: Normal rate and regular rhythm.     Heart sounds: Normal heart sounds. No murmur heard.   No  friction rub.  Pulmonary:     Effort: Pulmonary effort is normal. Tachypnea present. No respiratory distress.     Breath sounds: Examination of the left-upper field reveals decreased breath sounds. Examination of the left-middle field reveals decreased breath sounds. Examination of the right-lower field reveals decreased breath sounds. Examination of the left-lower field reveals decreased breath sounds. Decreased breath sounds present. No wheezing or rales.  Abdominal:     General: There is no distension.     Palpations: Abdomen is soft.  Tenderness: There is no abdominal tenderness.  Musculoskeletal:     Cervical back: Neck supple.  Skin:    General: Skin is warm.     Capillary Refill: Capillary refill takes less than 2 seconds.  Neurological:     Mental Status: He is alert and oriented to person, place, and time.     Motor: No abnormal muscle tone.      ____________________________________________   LABS (all labs ordered are listed, but only abnormal results are displayed)  Labs Reviewed  CBC WITH DIFFERENTIAL/PLATELET - Abnormal; Notable for the following components:      Result Value   RBC 4.03 (*)    Hemoglobin 12.4 (*)    All other components within normal limits  COMPREHENSIVE METABOLIC PANEL - Abnormal; Notable for the following components:   Chloride 88 (*)    CO2 37 (*)    Glucose, Bld 125 (*)    Albumin 3.4 (*)    All other components within normal limits  BRAIN NATRIURETIC PEPTIDE - Abnormal; Notable for the following components:   B Natriuretic Peptide 739.8 (*)    All other components within normal limits  TROPONIN I (HIGH SENSITIVITY) - Abnormal; Notable for the following components:   Troponin I (High Sensitivity) 18 (*)    All other components within normal limits  RESP PANEL BY RT-PCR (FLU A&B, COVID) ARPGX2  PROTIME-INR    ____________________________________________  EKG: Atrial fibrillation, ventricular rate 63.  QRS 149, QTc 515.  No acute ST  elevations or depressions.  Moderate baseline wander/artifact. ________________________________________  RADIOLOGY All imaging, including plain films, CT scans, and ultrasounds, independently reviewed by me, and interpretations confirmed via formal radiology reads.  ED MD interpretation:   Chest x-ray: Large left base collapse/consolidation with pleural effusion, small right effusion, diffuse edema   Official radiology report(s): DG Chest Portable 1 View  Result Date: 02/24/2021 CLINICAL DATA:  Shortness of breath. EXAM: PORTABLE CHEST 1 VIEW COMPARISON:  01/12/2021 FINDINGS: 0801 hours. Low lung volumes. The cardio pericardial silhouette is enlarged. Diffuse interstitial opacity suggests edema. Retrocardiac left base collapse/consolidation is associated with small to moderate left and small right pleural effusions. The visualized bony structures of the thorax show no acute abnormality. Telemetry leads overlie the chest. IMPRESSION: Low volume chest film with diffuse interstitial pulmonary edema and left base collapse/consolidation. Small to moderate left pleural effusion with small right effusion Electronically Signed   By: Kennith Center M.D.   On: 02/24/2021 08:34    ____________________________________________  PROCEDURES   Procedure(s) performed (including Critical Care):  Procedures  ____________________________________________  INITIAL IMPRESSION / MDM / ASSESSMENT AND PLAN / ED COURSE  As part of my medical decision making, I reviewed the following data within the electronic MEDICAL RECORD NUMBER Nursing notes reviewed and incorporated, Old chart reviewed, Notes from prior ED visits, and Why Controlled Substance Database       *Abeer Iversen Bruun was evaluated in Emergency Department on 02/24/2021 for the symptoms described in the history of present illness. He was evaluated in the context of the global COVID-19 pandemic, which necessitated consideration that the patient might be at  risk for infection with the SARS-CoV-2 virus that causes COVID-19. Institutional protocols and algorithms that pertain to the evaluation of patients at risk for COVID-19 are in a state of rapid change based on information released by regulatory bodies including the CDC and federal and state organizations. These policies and algorithms were followed during the patient's care in the ED.  Some ED evaluations  and interventions may be delayed as a result of limited staffing during the pandemic.*     Medical Decision Making: 87 year old male here with shortness of breath, generalized weakness.  On arrival, patient dyspneic, with markedly diminished breath sounds on the left.  Chest x-ray shows significant pulmonary edema as well as left-sided effusion.  Lab work reviewed, is her most notable for significantly elevated BNP and troponin I suspect acute CHF with respiratory distress exacerbated by left-sided likely transudative effusion.  I reviewed his previous thoracenteses results which are consistent with transudative effusion.  Will plan to start Lasix, BiPAP for work of breathing, and admit to medicine.  I have ordered an ultrasound thoracentesis of the left lung.  Admit to medicine.  ____________________________________________  FINAL CLINICAL IMPRESSION(S) / ED DIAGNOSES  Final diagnoses:  Pleural effusion on left  Acute on chronic congestive heart failure, unspecified heart failure type (HCC)     MEDICATIONS GIVEN DURING THIS VISIT:  Medications  furosemide (LASIX) injection 40 mg (has no administration in time range)     ED Discharge Orders     None        Note:  This document was prepared using Dragon voice recognition software and may include unintentional dictation errors.   Shaune PollackIsaacs, Leith Szafranski, MD 02/24/21 [phone]

## 2021-02-24 NOTE — ED Triage Notes (Signed)
BIB EMS from home. SOB. Scheduled procedure for upcoming monday to drain fluid from lung but today when he woke up this morning he was so SOB he couldnt do his normal morning routine. So he called 911. No treatment by EMS.  97.7 170/75

## 2021-02-24 NOTE — ED Notes (Addendum)
Moved pt to hospital bed for comfort. During standing, pivoting and sitting in new bed. Pt became more SOB upon exertion. O2 dropped to 85% then returned to 94%.

## 2021-02-24 NOTE — H&P (Addendum)
History and Physical    Brian SellerJohn A Lara JXB:[id number] DOB: 01-04-1926 DOA: 02/24/2021  PCP: Mick SellFitzgerald, David P, MD   [facility]  I have personally briefly reviewed patient's [facility]  Chief Complaint: Shortness of breath  HPI: Brian SellerJohn A Lara is a 87 y.o. male with medical history significant for atrial fibrillation not on anticoagulation, COPD with chronic respiratory failure on 3 L of oxygen, history of chronic diastolic dysfunction CHF with last known LVEF of 55% from 06/22, hypertension and dyslipidemia who was brought into the ER by EMS for evaluation of shortness of breath. Patient was recently hospitalized and is status post left-sided thoracentesis with improvement in his symptoms.  He is scheduled for repeat thoracentesis on 02/26/21 but ended up in the emergency room. Patient and his granddaughter state that he has had issues with shortness of breath but he felt worse in the early hours of the morning on the day of his admission. He had his oxygen on at 3 L but felt he was not getting enough air.  He denies having any orthopnea.  He has bilateral lower extremity swelling but his granddaughter states that it is improved from a couple of days ago since he started wearing his compression socks.  He has some chest discomfort over the left anterior chest wall but denied having any chest pain.  He denied having any nausea, no vomiting, no palpitations, no diaphoresis.  He denies having any changes in his bowel habits, no abdominal pain, no fever, no chills, no urinary symptoms, no dizziness, no lightheadedness, no headache, no focal deficits, no blurred vision. Labs show sodium 138, potassium 3.5, chloride 88, bicarb 27, glucose 125, BUN 11, creatinine 0.81, calcium 9.0, anion gap 13, alkaline phosphatase 81, albumin 3.4, AST 22, ALT 14, total protein 7.2, total bilirubin 1.0, BNP 739, troponin 18, white count 9.6, hemoglobin 12.4,  hematocrit 39.7, MCV 98.5, RDW 13.6, platelet count 189 Respiratory viral panel is negative Chest x-ray reviewed by me shows diffuse interstitial edema and left lung collapse/consolidation.  Small to moderate left pleural effusion with small right effusion. Twelve-lead EKG reviewed by me shows atrial fibrillation.  Right bundle branch block and left anterior fascicular block.  ED Course: Patient is a 87 year old Caucasian male who presents to the emergency room for evaluation of worsening shortness of breath from his baseline. Chest x-ray shows diffuse interstitial edema. He received a dose of Lasix in the ER and will be admitted to the hospital for further evaluation   Review of Systems: As per HPI otherwise all other systems reviewed and negative.    Past Medical History:  Diagnosis Date   Atrial fibrillation (HCC)    COPD (chronic obstructive pulmonary disease) (HCC)    Hypercholesteremia    Hypertension     Past Surgical History:  Procedure Laterality Date   CHOLECYSTECTOMY     REPLACEMENT TOTAL KNEE Bilateral      reports that he has never smoked. He has never used smokeless tobacco. He reports previous drug use. He reports that he does not drink alcohol.  Allergies  Allergen Reactions   Codeine Other (See Comments)   Morphine And Related Other (See Comments)    Family History  Problem Relation Age of Onset   Hypertension Mother       Prior to Admission medications   Medication Sig Start Date End Date Taking? Authorizing Provider  amLODipine (NORVASC) 10 MG tablet Take 1 tablet (10 mg total) by  mouth daily. 01/15/21 02/14/21  Lynn Ito, MD  aspirin 325 MG tablet Take 325 mg by mouth daily.    [provider]  atorvastatin (LIPITOR) 10 MG tablet Take 10 mg by mouth daily.    [provider]  Butalbital-APAP-Caffeine [phone] MG capsule Take 1 capsule by mouth daily as needed for headache. 08/17/19   [provider]  Cholecalciferol 25 MCG  (1000 UT) capsule Take 1,000 Units by mouth daily.    [provider]  citalopram (CELEXA) 10 MG tablet Take 10 mg by mouth daily.    [provider]  fexofenadine (ALLEGRA) 180 MG tablet Take 60 mg by mouth daily.    [provider]  finasteride (PROSCAR) 5 MG tablet Take 5 mg by mouth daily.    [provider]  fluticasone (FLONASE) 50 MCG/ACT nasal spray Place 1 spray into both nostrils daily.    [provider]  furosemide (LASIX) 40 MG tablet Take 1 tablet (40 mg total) by mouth daily. 01/15/21 02/14/21  Lynn Ito, MD  gabapentin (NEURONTIN) 100 MG capsule Take 100 mg by mouth 2 (two) times daily. 09/12/20   [provider]  Glucosamine Sulfate 1000 MG CAPS Take 1 tablet by mouth 2 (two) times daily.    [provider]  metoprolol tartrate (LOPRESSOR) 50 MG tablet Take 50 mg by mouth 2 (two) times daily. 08/17/20   [provider]  mirtazapine (REMERON) 15 MG tablet Take 15 mg by mouth at bedtime. 11/02/20   [provider]  Multiple Vitamin (MULTI-VITAMIN) tablet Take 1 tablet by mouth daily.    [provider]  Omega-3 Fatty Acids (FISH OIL) 1000 MG CPDR Take 1 capsule by mouth in the morning and at bedtime.    [provider]  psyllium (METAMUCIL) 58.6 % packet Take 1 packet by mouth daily.    [provider]    Physical Exam: Vitals:   02/24/21 0748  BP: (!) 167/91  Pulse: (!) 56  Resp: (!) 26  Temp: 98 F (36.7 C)  TempSrc: Oral  SpO2: 95%     Vitals:   02/24/21 0748  BP: (!) 167/91  Pulse: (!) 56  Resp: (!) 26  Temp: 98 F (36.7 C)  TempSrc: Oral  SpO2: 95%      Constitutional: Alert and oriented x 3 .  Appears to be in mild respiratory distress.  Hard of hearing HEENT:      Head: Normocephalic and atraumatic.         Eyes: PERLA, EOMI, Conjunctivae are normal. Sclera is non-icteric.       Mouth/Throat: Mucous membranes are moist.       Neck: Supple with no  signs of meningismus. Cardiovascular: Irregularly irregular. No murmurs, gallops, or rubs. 2+ symmetrical distal pulses are present . No JVD.  Trace LE edema Respiratory: Respiratory effort normal .  Crackles in both lung fields bilaterally. No wheezes or rhonchi.  Gastrointestinal: Soft, non tender, and non distended with positive bowel sounds.  Ventral wall hernia Genitourinary: No CVA tenderness. Musculoskeletal: Nontender with normal range of motion in all extremities. No cyanosis, or erythema of extremities. Neurologic:  Face is symmetric. Moving all extremities. No gross focal neurologic deficits . Skin: Skin is warm, dry.  No rash or ulcers Psychiatric: Mood and affect are normal    Labs on Admission: I have personally reviewed following labs and imaging studies  CBC: Recent Labs  Lab 02/24/21 0757  WBC 9.6  NEUTROABS 5.9  HGB  12.4*  HCT 39.7  MCV 98.5  PLT 189   Basic Metabolic Panel: Recent Labs  Lab 02/24/21 0757  NA 138  K 3.5  CL 88*  CO2 37*  GLUCOSE 125*  BUN 11  CREATININE 0.81  CALCIUM 9.0   GFR: CrCl cannot be calculated (Unknown ideal weight.). Liver Function Tests: Recent Labs  Lab 02/24/21 0757  AST 22  ALT 14  ALKPHOS 81  BILITOT 1.0  PROT 7.2  ALBUMIN 3.4*   No results for input(s): LIPASE, AMYLASE in the last 168 hours. No results for input(s): AMMONIA in the last 168 hours. Coagulation Profile: Recent Labs  Lab 02/24/21 0757  INR 1.1   Cardiac Enzymes: No results for input(s): CKTOTAL, CKMB, CKMBINDEX, TROPONINI in the last 168 hours. BNP (last 3 results) No results for input(s): PROBNP in the last 8760 hours. HbA1C: No results for input(s): HGBA1C in the last 72 hours. CBG: No results for input(s): GLUCAP in the last 168 hours. Lipid Profile: No results for input(s): CHOL, HDL, LDLCALC, TRIG, CHOLHDL, LDLDIRECT in the last 72 hours. Thyroid Function Tests: No results for input(s): TSH, T4TOTAL, FREET4, T3FREE, THYROIDAB in  the last 72 hours. Anemia Panel: No results for input(s): VITAMINB12, FOLATE, FERRITIN, TIBC, IRON, RETICCTPCT in the last 72 hours. Urine analysis:    Component Value Date/Time   COLORURINE YELLOW (A) 01/17/2016 1945   APPEARANCEUR CLEAR (A) 01/17/2016 1945   LABSPEC 1.009 01/17/2016 1945   PHURINE 6.0 01/17/2016 1945   GLUCOSEU NEGATIVE 01/17/2016 1945   HGBUR NEGATIVE 01/17/2016 1945   BILIRUBINUR NEGATIVE 01/17/2016 1945   KETONESUR NEGATIVE 01/17/2016 1945   PROTEINUR NEGATIVE 01/17/2016 1945   NITRITE NEGATIVE 01/17/2016 1945   LEUKOCYTESUR NEGATIVE 01/17/2016 1945    Radiological Exams on Admission: DG Chest Portable 1 View  Result Date: 02/24/2021 CLINICAL DATA:  Shortness of breath. EXAM: PORTABLE CHEST 1 VIEW COMPARISON:  01/12/2021 FINDINGS: 0801 hours. Low lung volumes. The cardio pericardial silhouette is enlarged. Diffuse interstitial opacity suggests edema. Retrocardiac left base collapse/consolidation is associated with small to moderate left and small right pleural effusions. The visualized bony structures of the thorax show no acute abnormality. Telemetry leads overlie the chest. IMPRESSION: Low volume chest film with diffuse interstitial pulmonary edema and left base collapse/consolidation. Small to moderate left pleural effusion with small right effusion Electronically Signed   By: Kennith Center M.D.   On: 02/24/2021 08:34     Assessment/Plan Principal Problem:   Acute on chronic diastolic CHF (congestive heart failure) (HCC) Active Problems:   Hypertension   Atrial fibrillation (HCC)   Depression   CKD (chronic kidney disease), stage IIIa   Chronic respiratory failure (HCC)      Acute on chronic diastolic dysfunction CHF Patient presents for evaluation of worsening shortness of breath from his baseline chest x-ray shows pulmonary edema. BNP is elevated at greater than 700 compared to 300 during his last hospitalization 2D echocardiogram from June, 2022  shows an LVEF of 50 to 55% Optimize blood pressure control, continue metoprolol and amlodipine Place patient on Lasix 40 mg IV daily Maintain low-sodium diet           Chronic atrial fibrillation Rate controlled on metoprolol Not on long-term anticoagulation due to increased fall risk Continue aspirin 325 mg daily         Depression Continue mirtazapine and citalopram       Obesity  Complicates overall prognosis and care       COPD with chronic respiratory  failure Not acutely exacerbated Continue as needed bronchodilator therapy and inhaled steroids. Continue oxygen supplementation at 3 L to maintain pulse oximetry greater than 88%.       DVT prophylaxis: Lovenox  Code Status: DO NOT RESUSCITATE Family Communication: Greater than 50% of time was spent discussing patient's condition and plan of care with him and his granddaughter who is his healthcare power of attorney at the bedside.  All questions and concerns have been addressed.  He verbalized understanding and agree with the plan.  CODE STATUS was discussed and he is a DNR. Disposition Plan: Back to previous home environment Consults called: none  Status: At the time of admission, it appears that the appropriate admission status for this patient is inpatient. This is judged to be reasonable and necessary in order to provide the required intensity of service to ensure the patient's safety given the presenting symptoms, physical exam findings, and initial radiographic and laboratory data in the context of their comorbid conditions. Patient requires inpatient status due to high intensity of service, high risk for further deterioration and high frequency of surveillance required.    Lucile Shutters MD [facility]     02/24/2021, 10:07 AM

## 2021-02-25 DIAGNOSIS — I5033 Acute on chronic diastolic (congestive) heart failure: Secondary | ICD-10-CM | POA: Diagnosis not present

## 2021-02-25 LAB — BASIC METABOLIC PANEL
Anion gap: 12 (ref 5–15)
BUN: 11 mg/dL (ref 8–23)
CO2: 40 mmol/L — ABNORMAL HIGH (ref 22–32)
Calcium: 8.9 mg/dL (ref 8.9–10.3)
Chloride: 89 mmol/L — ABNORMAL LOW (ref 98–111)
Creatinine, Ser: 0.84 mg/dL (ref 0.61–1.24)
GFR, Estimated: >60 mL/min (ref >60)
Glucose, Bld: 100 mg/dL — ABNORMAL HIGH (ref 70–99)
Potassium: 3.4 mmol/L — ABNORMAL LOW (ref 3.5–5.1)
Sodium: 141 mmol/L (ref 135–145)

## 2021-02-25 LAB — PROCALCITONIN: Procalcitonin: <0.1 ng/mL

## 2021-02-25 MED ORDER — FUROSEMIDE 10 MG/ML IJ SOLN
40.0000 mg | Freq: Two times a day (BID) | INTRAMUSCULAR | Status: DC
  Administered 2021-02-25 – 2021-02-28 (×6): 40 mg via INTRAVENOUS
  Filled 2021-02-25 (×6): qty 4

## 2021-02-25 NOTE — Progress Notes (Addendum)
Patient reassigned to MD Kurtis Bushman per family request by MD Billie Ruddy. Granddaughter and Son displeased with difference in plan of care on unit versus what they were told by ED MD. (ED MD told family patient would have thoracentesis upon arrival to unit 2A per family account of events). IR not on premises during weekend. Family updated by MD Billie Ruddy on treatment plan for thoracentesis on Monday as scheduled. MD Paraschos at bedside confirming MD Billie Ruddy plan of care with family as well to ensure adequate understanding of circumstances.  This RN spoke with family at bedside at length about care being provided to patient. Family agreed for patient to receive ordered Lasix for this evening. Patient agreeable to receive Lasix upon administration by this RN. Patient resting in bed comfortably throughout the afternoon with frequent assessments from this RN. All needs met for patient. Will continue to monitor closely.

## 2021-02-25 NOTE — Progress Notes (Signed)
Patient's family requested change in provider status. MD Fran Lowes notified and went to bedside to assess. Awaiting new orders/changes.

## 2021-02-25 NOTE — Progress Notes (Signed)
PROGRESS NOTE    Brian Lara  XAJ:[id number] DOB: 1926/07/13 DOA: 02/24/2021 PCP: Mick Sell, MD  [phone]   Assessment & Plan:   Principal Problem:   Acute on chronic diastolic CHF (congestive heart failure) (HCC) Active Problems:   Hypertension   Atrial fibrillation (HCC)   Depression   CKD (chronic kidney disease), stage IIIa   Chronic respiratory failure (HCC)   Brian Lara is a 87 y.o. male with medical history significant for atrial fibrillation not on anticoagulation, COPD with chronic respiratory failure on 4 L of oxygen, history of chronic diastolic dysfunction CHF with last known LVEF of 55% from 06/22, hypertension and dyslipidemia who was brought into the ER by EMS for evaluation of shortness of breath. Patient was recently hospitalized and is status post left-sided thoracentesis with improvement in his symptoms.  He is scheduled for repeat thoracentesis on 02/26/21 but ended up in the emergency room.   Acute on chronic diastolic CHF --chest x-ray shows pulmonary edema.  BNP is elevated at greater than 700 compared to 300 during his last hospitalization. --started on IV lasix 40 mg daily on presentation. --Family claimed that outpatient cardiologist/PCP did not want pt to receive more lasix than his home dose of oral lasix 40 mg daily, therefore was initially disapproving of the increase in diuretic therapy. Plan: --increase Lasix 40 mg to BID since Cr normal. --strict I/O --cardiology consult, who agrees with the diuretic plan  Left pleural effusion --with left lower lobe collapse. --last thoracentesis in June, appeared transudate.   --family was told and expected thoracentesis would be performed upon arrival to the floor, however, non-emergent thora can not be performed on weekends due to no IR available. Plan: --thoracentesis scheduled for tomorrow Monday --no need for fluid studies  Dyspnea and dry cough Acute on chronic hypoxic  respiratory failure Chronic hypoxic respiratory failure on 4L O2 --Pt was discharged on 4L O2 after his June hospitalization.  Needed 5L on presentation.   --due to pulm edema and pleural effusion.  No fever, leukocytosis to suggest PNA, and procal neg.  No wheezing or increased sputum production to suggest COPD exacerbation.   Plan: --treat with diuresis and thoracentesis as above --Continue supplemental O2 to keep sats between 88-92%, wean as tolerated  Chronic atrial fibrillation Rate controlled on metoprolol Not on long-term anticoagulation due to increased fall risk, however, on ASA 325 mg daily Plan: --cont metop --Hold ASA 325 due to upcoming thora  HTN --cont amlodipine and metop --cont IV lasix   Depression Continue mirtazapine and citalopram   Obesity  Complicates overall prognosis and care   COPD with chronic respiratory failure Not acutely exacerbated --not on daily bronchodilators    DVT prophylaxis: Lovenox SQ Code Status: DNR  Family Communication: son and granddaughter updated at bedside today Level of care: Progressive Cardiac Dispo:   The patient is from: home Anticipated d/c is to: home Anticipated d/c date is: 2-3 days Patient currently is not medically ready to d/c due to: need IV diuresis and thoracentesis   Subjective and Interval History:  Pt reported feeling weak.  Leg swelling improved.   Objective: Vitals:   02/25/21 1700 02/25/21 1948 02/25/21 2100 02/25/21 2220  BP: 134/77 (!) 144/60  133/61  Pulse: 61 60  (!) 52  Resp: 14 20 20  (!) 24  Temp: 98.3 F (36.8 C) 98.7 F (37.1 C)    TempSrc:  Oral    SpO2: 95% 94%     No intake or  output data in the 24 hours ending 02/26/21 0047 There were no vitals filed for this visit.  Examination:   Constitutional: NAD, AAOx3 HEENT: conjunctivae and lids normal, EOMI CV: No cyanosis.   RESP: normal respiratory effort, reduced lung sounds, on 5L Extremities: No effusions, edema in  BLE SKIN: warm, dry Neuro: II - XII grossly intact.   Psych: Normal mood and affect.     Data Reviewed: I have personally reviewed following labs and imaging studies  CBC: Recent Labs  Lab 02/24/21 0757  WBC 9.6  NEUTROABS 5.9  HGB 12.4*  HCT 39.7  MCV 98.5  PLT 189   Basic Metabolic Panel: Recent Labs  Lab 02/24/21 0757 02/25/21 0613  NA 138 141  K 3.5 3.4*  CL 88* 89*  CO2 37* 40*  GLUCOSE 125* 100*  BUN 11 11  CREATININE 0.81 0.84  CALCIUM 9.0 8.9   GFR: CrCl cannot be calculated (Unknown ideal weight.). Liver Function Tests: Recent Labs  Lab 02/24/21 0757  AST 22  ALT 14  ALKPHOS 81  BILITOT 1.0  PROT 7.2  ALBUMIN 3.4*   No results for input(s): LIPASE, AMYLASE in the last 168 hours. No results for input(s): AMMONIA in the last 168 hours. Coagulation Profile: Recent Labs  Lab 02/24/21 0757  INR 1.1   Cardiac Enzymes: No results for input(s): CKTOTAL, CKMB, CKMBINDEX, TROPONINI in the last 168 hours. BNP (last 3 results) No results for input(s): PROBNP in the last 8760 hours. HbA1C: No results for input(s): HGBA1C in the last 72 hours. CBG: No results for input(s): GLUCAP in the last 168 hours. Lipid Profile: No results for input(s): CHOL, HDL, LDLCALC, TRIG, CHOLHDL, LDLDIRECT in the last 72 hours. Thyroid Function Tests: No results for input(s): TSH, T4TOTAL, FREET4, T3FREE, THYROIDAB in the last 72 hours. Anemia Panel: No results for input(s): VITAMINB12, FOLATE, FERRITIN, TIBC, IRON, RETICCTPCT in the last 72 hours. Sepsis Labs: Recent Labs  Lab 02/25/21 [id number]  PROCALCITON <0.10    Recent Results (from the past 240 hour(s))  Resp Panel by RT-PCR (Flu A&B, Covid) Nasopharyngeal Swab     Status: None   Collection Time: 02/24/21  7:57 AM   Specimen: Nasopharyngeal Swab; Nasopharyngeal(NP) swabs in vial transport medium  Result Value Ref Range Status   SARS Coronavirus 2 by RT PCR NEGATIVE NEGATIVE Final    Comment: (NOTE) SARS-CoV-2  target nucleic acids are NOT DETECTED.  The SARS-CoV-2 RNA is generally detectable in upper respiratory specimens during the acute phase of infection. The lowest concentration of SARS-CoV-2 viral copies this assay can detect is 138 copies/mL. A negative result does not preclude SARS-Cov-2 infection and should not be used as the sole basis for treatment or other patient management decisions. A negative result may occur with  improper specimen collection/handling, submission of specimen other than nasopharyngeal swab, presence of viral mutation(s) within the areas targeted by this assay, and inadequate number of viral copies(<138 copies/mL). A negative result must be combined with clinical observations, patient history, and epidemiological information. The expected result is Negative.  Fact Sheet for Patients:  BloggerCourse.comhttps://www.fda.gov/media/152166/download  Fact Sheet for Healthcare Providers:  SeriousBroker.ithttps://www.fda.gov/media/152162/download  This test is no t yet approved or cleared by the [place] FDA and  has been authorized for detection and/or diagnosis of SARS-CoV-2 by FDA under an Emergency Use Authorization (EUA). This EUA will remain  in effect (meaning this test can be used) for the duration of the COVID-19 declaration under Section 564(b)(1) of the Act, 21 U.S.C.section  360bbb-3(b)(1), unless the authorization is terminated  or revoked sooner.       Influenza A by PCR NEGATIVE NEGATIVE Final   Influenza B by PCR NEGATIVE NEGATIVE Final    Comment: (NOTE) The Xpert Xpress SARS-CoV-2/FLU/RSV plus assay is intended as an aid in the diagnosis of influenza from Nasopharyngeal swab specimens and should not be used as a sole basis for treatment. Nasal washings and aspirates are unacceptable for Xpert Xpress SARS-CoV-2/FLU/RSV testing.  Fact Sheet for Patients: BloggerCourse.com  Fact Sheet for Healthcare  Providers: SeriousBroker.it  This test is not yet approved or cleared by the [place] FDA and has been authorized for detection and/or diagnosis of SARS-CoV-2 by FDA under an Emergency Use Authorization (EUA). This EUA will remain in effect (meaning this test can be used) for the duration of the COVID-19 declaration under Section 564(b)(1) of the Act, 21 U.S.C. section 360bbb-3(b)(1), unless the authorization is terminated or revoked.  Performed at [facility], [address] [place], [place] [address]       Radiology Studies: CT CHEST WO CONTRAST  Result Date: 02/24/2021 CLINICAL DATA:  Pneumonia, effusion or abscess suspected, xray done EXAM: CT CHEST WITHOUT CONTRAST TECHNIQUE: Multidetector CT imaging of the chest was performed following the standard protocol without IV contrast. COMPARISON:  January 11, 2021 FINDINGS: Cardiovascular: Cardiomegaly. Small pericardial effusion, minimally increased since prior. Three-vessel coronary artery atherosclerotic calcifications. Severe atherosclerotic calcifications of the aorta. Mediastinum/Nodes: Unchanged appearance of a heterogeneously enlarged LEFT thyroid gland. Mucous within the trachea. No axillary adenopathy. Prominent mediastinal lymph nodes, similar in comparison to prior with representative pretracheal lymph node measures 9 mm in the short axis, unchanged (series 2, image 53). Lungs/Pleura: There are moderate to large bilateral pleural effusions, similar in comparison to prior. Centrilobular emphysema. Near complete LEFT lower lobe collapse, minimally improved in comparison to prior. RIGHT lower lobe atelectasis, similar in comparison to prior. Evaluation for fine parenchymal detail is limited due to respiratory motion. Interlobular septal thickening. Increased centrilobular nodularity throughout the RIGHT upper lobe and RIGHT middle lobe in comparison to prior. Resolution of RIGHT middle lobe  ground-glass opacities. Upper Abdomen: Patulous esophagus.  No acute abnormality. Musculoskeletal: Multilevel degenerative changes of the thoracic spine. Remote LEFT anterior seventh rib fracture IMPRESSION: 1. Moderate to large bilateral pleural effusions, similar in comparison to prior CT. 2. Bibasilar atelectasis with near complete LEFT lower lobe collapse. 3. Interlobular septal thickening with scattered areas of centrilobular nodularity may reflect underlying pulmonary edema versus infectious/inflammatory etiology. 4. Cardiomegaly with a small pericardial effusion, minimally increased in size in comparison to prior. 5. Unchanged heterogeneously enlarged LEFT thyroid gland. In the setting of significant comorbidities or limited life expectancy, no follow-up imaging is recommended (ref: J Am Coll Radiol. 2015 Feb;12(2): 143-50). Aortic Atherosclerosis (ICD10-I70.0) and Emphysema (ICD10-J43.9). Electronically Signed   By: Meda Klinefelter MD   On: 02/24/2021 12:20   DG Chest Portable 1 View  Result Date: 02/24/2021 CLINICAL DATA:  Shortness of breath. EXAM: PORTABLE CHEST 1 VIEW COMPARISON:  01/12/2021 FINDINGS: 0801 hours. Low lung volumes. The cardio pericardial silhouette is enlarged. Diffuse interstitial opacity suggests edema. Retrocardiac left base collapse/consolidation is associated with small to moderate left and small right pleural effusions. The visualized bony structures of the thorax show no acute abnormality. Telemetry leads overlie the chest. IMPRESSION: Low volume chest film with diffuse interstitial pulmonary edema and left base collapse/consolidation. Small to moderate left pleural effusion with small right effusion Electronically Signed   By: Kennith Center  M.D.   On: 02/24/2021 08:34     Scheduled Meds:  amLODipine  10 mg Oral Daily   aspirin EC  325 mg Oral Daily   cholecalciferol  1,000 Units Oral Daily   citalopram  10 mg Oral Daily   enoxaparin (LOVENOX) injection  40 mg  Subcutaneous Q24H   finasteride  5 mg Oral Daily   fluticasone  1 spray Each Nare Daily   furosemide  40 mg Intravenous BID   gabapentin  100 mg Oral BID   loratadine  10 mg Oral Daily   metoprolol tartrate  50 mg Oral BID   mirtazapine  15 mg Oral QHS   multivitamin with minerals  1 tablet Oral Daily   omega-3 acid ethyl esters  1 g Oral BID   psyllium  1 packet Oral Daily   sodium chloride flush  3 mL Intravenous Q12H   Continuous Infusions:  sodium chloride       LOS: 2 days     Darlin Priestly, MD [facility] If 7PM-7AM, please contact night-coverage 02/26/2021, 12:47 AM

## 2021-02-25 NOTE — Progress Notes (Signed)
MD Billie Ruddy notified via secure chat about families request for thoracentesis and concern about lasix administration. MD acknowledged message and confirmed she will assess patient at bedside with family. Cardiology consult in at this time as well. All patient needs met at this time. Patient continues to be bradycardic since admission to unit. Mainly sustaining 40s-55. Patient resting comfortably in bed at this time.

## 2021-02-25 NOTE — Consult Note (Signed)
[facility] Cardiology  CARDIOLOGY CONSULT NOTE  [name] MRN: [id number] DOB/AGE: [birth year] 87 y.o.  Admit date: 02/24/2021 Referring Physician Fran Lowes Primary Physician [facility] Primary Cardiologist Fath Reason for Consultation congestive heart failure  HPI: 87 year old gentleman referred for evaluation of acute on chronic diastolic congestive heart failure.  Patient has a history of COPD, on 3 L O2 at home.  He is a resident of independent living at [facility].  The patient was recently hospitalized for similar diagnosis 01/11/2021 at which time 2D echocardiogram revealed normal left ventricular function with LVEF 50 to 55%.  During that hospitalization the patient underwent left thoracentesis with removal of 1.2 L and overall clinical improvement.  According to the granddaughter and son who regularly visit the patient, he has been experiencing 1 week history of increasing shortness of breath and peripheral edema.  The patient was brought to [facility] ED where BNP was 739 and chest x-ray revealed diffuse interstitial edema with moderate left pleural effusion.  The patient is already scheduled for repeat left thoracentesis on 02/26/2021.  ECG reveals atrial fibrillation at a rate of 63 bpm with right bundle branch block.  The patient has known history of chronic atrial fibrillation, times many years, currently not anticoagulated due to advanced age and risk of falling.  Review of systems complete and found to be negative unless listed above     Past Medical History:  Diagnosis Date   Atrial fibrillation (HCC)    COPD (chronic obstructive pulmonary disease) (HCC)    Hypercholesteremia    Hypertension     Past Surgical History:  Procedure Laterality Date   CHOLECYSTECTOMY     REPLACEMENT TOTAL KNEE Bilateral     Medications Prior to Admission  Medication Sig Dispense Refill Last Dose   amLODipine (NORVASC) 10 MG tablet Take 10 mg by mouth daily.   02/24/2021 at 0700   atorvastatin  (LIPITOR) 10 MG tablet Take 10 mg by mouth daily.   02/23/2021 at Unknown   Butalbital-APAP-Caffeine 50-325-40 MG capsule Take 1 capsule by mouth daily as needed for headache.   Unknown at PRN   Cholecalciferol 25 MCG (1000 UT) capsule Take 1,000 Units by mouth daily.      citalopram (CELEXA) 10 MG tablet Take 10 mg by mouth daily.   02/24/2021 at 0700   fexofenadine (ALLEGRA) 180 MG tablet Take 60 mg by mouth daily.      finasteride (PROSCAR) 5 MG tablet Take 5 mg by mouth daily.   02/23/2021 at Unknown   fluticasone (FLONASE) 50 MCG/ACT nasal spray Place 1 spray into both nostrils daily.      furosemide (LASIX) 40 MG tablet Take 40 mg by mouth daily.   02/24/2021 at 0700   gabapentin (NEURONTIN) 100 MG capsule Take 100 mg by mouth 2 (two) times daily.   02/24/2021 at 0700   Glucosamine Sulfate 1000 MG CAPS Take 1 tablet by mouth 2 (two) times daily.      metoprolol tartrate (LOPRESSOR) 50 MG tablet Take 50 mg by mouth 2 (two) times daily.   02/24/2021 at 0700   mirtazapine (REMERON) 15 MG tablet Take 15 mg by mouth at bedtime.   02/23/2021 at 2000   Multiple Vitamin (MULTI-VITAMIN) tablet Take 1 tablet by mouth daily.      Omega-3 Fatty Acids (FISH OIL) 1000 MG CPDR Take 1 capsule by mouth in the morning and at bedtime.      psyllium (METAMUCIL) 58.6 % packet Take 1 packet by mouth daily.  aspirin 325 MG tablet Take 325 mg by mouth daily.   02/21/2021 at Unknown   Social History   Socioeconomic History   Marital status: Widowed    Spouse name: Not on file   Number of children: Not on file   Years of education: Not on file   Highest education level: Not on file  Occupational History   Not on file  Tobacco Use   Smoking status: Never   Smokeless tobacco: Never  Substance and Sexual Activity   Alcohol use: Never   Drug use: Not Currently   Sexual activity: Not on file  Other Topics Concern   Not on file  Social History Narrative   Not on file   Social Determinants of Health    Financial Resource Strain: Not on file  Food Insecurity: Not on file  Transportation Needs: Not on file  Physical Activity: Not on file  Stress: Not on file  Social Connections: Not on file  Intimate Partner Violence: Not on file    Family History  Problem Relation Age of Onset   Hypertension Mother       Review of systems complete and found to be negative unless listed above      PHYSICAL EXAM  General: Well developed, well nourished, in no acute distress HEENT:  Normocephalic and atramatic Neck:  No JVD.  Lungs: Clear bilaterally to auscultation and percussion. Heart: HRRR . Normal S1 and S2 without gallops or murmurs.  Abdomen: Bowel sounds are positive, abdomen soft and non-tender  Msk:  Back normal, normal gait. Normal strength and tone for age. Extremities: No clubbing, cyanosis or edema.   Neuro: Alert and oriented X 3. Psych:  Good affect, responds appropriately  Labs:   Lab Results  Component Value Date   WBC 9.6 02/24/2021   HGB 12.4 (L) 02/24/2021   HCT 39.7 02/24/2021   MCV 98.5 02/24/2021   PLT 189 02/24/2021    Recent Labs  Lab 02/24/21 0757 02/25/21 0613  NA 138 141  K 3.5 3.4*  CL 88* 89*  CO2 37* 40*  BUN 11 11  CREATININE 0.81 0.84  CALCIUM 9.0 8.9  PROT 7.2  --   BILITOT 1.0  --   ALKPHOS 81  --   ALT 14  --   AST 22  --   GLUCOSE 125* 100*   Lab Results  Component Value Date   TROPONINI <0.03 04/30/2018   No results found for: CHOL No results found for: HDL No results found for: LDLCALC No results found for: TRIG No results found for: CHOLHDL No results found for: LDLDIRECT    Radiology: CT CHEST WO CONTRAST  Result Date: 02/24/2021 CLINICAL DATA:  Pneumonia, effusion or abscess suspected, xray done EXAM: CT CHEST WITHOUT CONTRAST TECHNIQUE: Multidetector CT imaging of the chest was performed following the standard protocol without IV contrast. COMPARISON:  January 11, 2021 FINDINGS: Cardiovascular: Cardiomegaly. Small  pericardial effusion, minimally increased since prior. Three-vessel coronary artery atherosclerotic calcifications. Severe atherosclerotic calcifications of the aorta. Mediastinum/Nodes: Unchanged appearance of a heterogeneously enlarged LEFT thyroid gland. Mucous within the trachea. No axillary adenopathy. Prominent mediastinal lymph nodes, similar in comparison to prior with representative pretracheal lymph node measures 9 mm in the short axis, unchanged (series 2, image 53). Lungs/Pleura: There are moderate to large bilateral pleural effusions, similar in comparison to prior. Centrilobular emphysema. Near complete LEFT lower lobe collapse, minimally improved in comparison to prior. RIGHT lower lobe atelectasis, similar in comparison to prior. Evaluation for  fine parenchymal detail is limited due to respiratory motion. Interlobular septal thickening. Increased centrilobular nodularity throughout the RIGHT upper lobe and RIGHT middle lobe in comparison to prior. Resolution of RIGHT middle lobe ground-glass opacities. Upper Abdomen: Patulous esophagus.  No acute abnormality. Musculoskeletal: Multilevel degenerative changes of the thoracic spine. Remote LEFT anterior seventh rib fracture IMPRESSION: 1. Moderate to large bilateral pleural effusions, similar in comparison to prior CT. 2. Bibasilar atelectasis with near complete LEFT lower lobe collapse. 3. Interlobular septal thickening with scattered areas of centrilobular nodularity may reflect underlying pulmonary edema versus infectious/inflammatory etiology. 4. Cardiomegaly with a small pericardial effusion, minimally increased in size in comparison to prior. 5. Unchanged heterogeneously enlarged LEFT thyroid gland. In the setting of significant comorbidities or limited life expectancy, no follow-up imaging is recommended (ref: J Am Coll Radiol. 2015 Feb;12(2): 143-50). Aortic Atherosclerosis (ICD10-I70.0) and Emphysema (ICD10-J43.9). Electronically Signed   By:  Meda Klinefelter MD   On: 02/24/2021 12:20   DG Chest Portable 1 View  Result Date: 02/24/2021 CLINICAL DATA:  Shortness of breath. EXAM: PORTABLE CHEST 1 VIEW COMPARISON:  01/12/2021 FINDINGS: 0801 hours. Low lung volumes. The cardio pericardial silhouette is enlarged. Diffuse interstitial opacity suggests edema. Retrocardiac left base collapse/consolidation is associated with small to moderate left and small right pleural effusions. The visualized bony structures of the thorax show no acute abnormality. Telemetry leads overlie the chest. IMPRESSION: Low volume chest film with diffuse interstitial pulmonary edema and left base collapse/consolidation. Small to moderate left pleural effusion with small right effusion Electronically Signed   By: Kennith Center M.D.   On: 02/24/2021 08:34    EKG: Atrial fibrillation at 63 bpm with underlying right bundle branch block  ASSESSMENT AND PLAN:   1.  Acute on chronic diastolic congestive heart failure, with persistent left pleural effusion, peripheral edema, BNP 739, on furosemide 40 mg daily at home, started on furosemide 40 mg IV twice daily 2.  COPD, on 3 L O2 3.  Moderate left pleural effusion, status postthoracentesis 01/11/2021, scheduled for repeat thoracentesis 02/26/2021 4.  Chronic atrial fibrillation, rate controlled on metoprolol tartrate, not anticoagulated due to advanced age, risk of falling, patient and family preference  Recommendations  1.  Agree with overall current therapy 2.  Continue diuresis 3.  Carefully monitor renal status 4.  Agree with repeat thoracentesis scheduled for 02/26/2021 5.  Defer chronic anticoagulation  Signed: Marcina Millard MD,PhD, [facility] 02/25/2021, 1:35 PM

## 2021-02-26 ENCOUNTER — Inpatient Hospital Stay: Payer: Medicare Other

## 2021-02-26 ENCOUNTER — Ambulatory Visit: Payer: Medicare Other

## 2021-02-26 DIAGNOSIS — I5033 Acute on chronic diastolic (congestive) heart failure: Secondary | ICD-10-CM | POA: Diagnosis not present

## 2021-02-26 LAB — BASIC METABOLIC PANEL
Anion gap: 10 (ref 5–15)
BUN: 13 mg/dL (ref 8–23)
CO2: 41 mmol/L — ABNORMAL HIGH (ref 22–32)
Calcium: 8.9 mg/dL (ref 8.9–10.3)
Chloride: 86 mmol/L — ABNORMAL LOW (ref 98–111)
Creatinine, Ser: 0.77 mg/dL (ref 0.61–1.24)
GFR, Estimated: >60 mL/min (ref >60)
Glucose, Bld: 98 mg/dL (ref 70–99)
Potassium: 3.4 mmol/L — ABNORMAL LOW (ref 3.5–5.1)
Sodium: 137 mmol/L (ref 135–145)

## 2021-02-26 LAB — CBC
HCT: 39.3 % (ref 39.0–52.0)
Hemoglobin: 12.5 g/dL — ABNORMAL LOW (ref 13.0–17.0)
MCH: 30.8 pg (ref 26.0–34.0)
MCHC: 31.8 g/dL (ref 30.0–36.0)
MCV: 96.8 fL (ref 80.0–100.0)
Platelets: 183 10*3/uL (ref 150–400)
RBC: 4.06 MIL/uL — ABNORMAL LOW (ref 4.22–5.81)
RDW: 13.3 % (ref 11.5–15.5)
WBC: 8.6 10*3/uL (ref 4.0–10.5)
nRBC: 0 % (ref 0.0–0.2)

## 2021-02-26 LAB — MAGNESIUM: Magnesium: 1.8 mg/dL (ref 1.7–2.4)

## 2021-02-26 MED ORDER — POTASSIUM CHLORIDE CRYS ER 20 MEQ PO TBCR
40.0000 meq | EXTENDED_RELEASE_TABLET | Freq: Once | ORAL | Status: AC
  Administered 2021-02-26: 40 meq via ORAL
  Filled 2021-02-26: qty 2

## 2021-02-26 NOTE — Progress Notes (Signed)
[facility]    SUBJECTIVE: The patient reports mild shortness of breath this morning, denies chest pain. He states he slept well last night.   Vitals:   02/25/21 2100 02/25/21 2220 02/26/21 0419 02/26/21 0750  BP:  133/61 (!) 151/83 (!) 168/88  Pulse:  (!) 52 (!) 56 64  Resp: 20 (!) 24 18 16   Temp:   97.8 F (36.6 C) (!) 97.5 F (36.4 C)  TempSrc:   Oral   SpO2:   92% 93%  Weight:   84 kg      Intake/Output Summary (Last 24 hours) at 02/26/2021 02/28/2021 Last data filed at 02/26/2021 0630 Gross per 24 hour  Intake 240 ml  Output 900 ml  Net -660 ml      PHYSICAL EXAM  General: Well developed, well nourished, sitting up in bed in no acute distress HEENT:  Normocephalic and atramatic Neck:  No JVD.  Lungs: diminished breath sounds throughout especially bilateral bases Heart: irregularly irregular without gallops or murmurs.  Msk:  Back normal,Normal strength and tone for age. Extremities: No clubbing, cyanosis or edema.   Neuro: Alert and oriented X 3. Psych:  Good affect, responds appropriately   LABS: Basic Metabolic Panel: Recent Labs    02/25/21 0613 02/26/21 0452  NA 141 137  K 3.4* 3.4*  CL 89* 86*  CO2 40* 41*  GLUCOSE 100* 98  BUN 11 13  CREATININE 0.84 0.77  CALCIUM 8.9 8.9  MG  --  1.8   Liver Function Tests: Recent Labs    02/24/21 0757  AST 22  ALT 14  ALKPHOS 81  BILITOT 1.0  PROT 7.2  ALBUMIN 3.4*   No results for input(s): LIPASE, AMYLASE in the last 72 hours. CBC: Recent Labs    02/24/21 0757 02/26/21 0452  WBC 9.6 8.6  NEUTROABS 5.9  --   HGB 12.4* 12.5*  HCT 39.7 39.3  MCV 98.5 96.8  PLT 189 183   Cardiac Enzymes: No results for input(s): CKTOTAL, CKMB, CKMBINDEX, TROPONINI in the last 72 hours. BNP: Invalid input(s): POCBNP D-Dimer: No results for input(s): DDIMER in the last 72 hours. Hemoglobin A1C: No results for input(s): HGBA1C in the last 72 hours. Fasting Lipid Panel: No results for input(s): CHOL, HDL,  LDLCALC, TRIG, CHOLHDL, LDLDIRECT in the last 72 hours. Thyroid Function Tests: No results for input(s): TSH, T4TOTAL, T3FREE, THYROIDAB in the last 72 hours.  Invalid input(s): FREET3 Anemia Panel: No results for input(s): VITAMINB12, FOLATE, FERRITIN, TIBC, IRON, RETICCTPCT in the last 72 hours.  CT CHEST WO CONTRAST  Result Date: 02/24/2021 CLINICAL DATA:  Pneumonia, effusion or abscess suspected, xray done EXAM: CT CHEST WITHOUT CONTRAST TECHNIQUE: Multidetector CT imaging of the chest was performed following the standard protocol without IV contrast. COMPARISON:  January 11, 2021 FINDINGS: Cardiovascular: Cardiomegaly. Small pericardial effusion, minimally increased since prior. Three-vessel coronary artery atherosclerotic calcifications. Severe atherosclerotic calcifications of the aorta. Mediastinum/Nodes: Unchanged appearance of a heterogeneously enlarged LEFT thyroid gland. Mucous within the trachea. No axillary adenopathy. Prominent mediastinal lymph nodes, similar in comparison to prior with representative pretracheal lymph node measures 9 mm in the short axis, unchanged (series 2, image 53). Lungs/Pleura: There are moderate to large bilateral pleural effusions, similar in comparison to prior. Centrilobular emphysema. Near complete LEFT lower lobe collapse, minimally improved in comparison to prior. RIGHT lower lobe atelectasis, similar in comparison to prior. Evaluation for fine parenchymal detail is limited due to respiratory motion. Interlobular septal thickening. Increased centrilobular nodularity throughout the  RIGHT upper lobe and RIGHT middle lobe in comparison to prior. Resolution of RIGHT middle lobe ground-glass opacities. Upper Abdomen: Patulous esophagus.  No acute abnormality. Musculoskeletal: Multilevel degenerative changes of the thoracic spine. Remote LEFT anterior seventh rib fracture IMPRESSION: 1. Moderate to large bilateral pleural effusions, similar in comparison to prior CT. 2.  Bibasilar atelectasis with near complete LEFT lower lobe collapse. 3. Interlobular septal thickening with scattered areas of centrilobular nodularity may reflect underlying pulmonary edema versus infectious/inflammatory etiology. 4. Cardiomegaly with a small pericardial effusion, minimally increased in size in comparison to prior. 5. Unchanged heterogeneously enlarged LEFT thyroid gland. In the setting of significant comorbidities or limited life expectancy, no follow-up imaging is recommended (ref: J Am Coll Radiol. 2015 Feb;12(2): 143-50). Aortic Atherosclerosis (ICD10-I70.0) and Emphysema (ICD10-J43.9). Electronically Signed   By: Meda Klinefelter MD   On: 02/24/2021 12:20     Echo LVEF 50-55%, mild to moderate MR  TELEMETRY: atrial fibrillation  ASSESSMENT AND PLAN:  Principal Problem:   Acute on chronic diastolic CHF (congestive heart failure) (HCC) Active Problems:   Hypertension   Atrial fibrillation (HCC)   Depression   CKD (chronic kidney disease), stage IIIa   Chronic respiratory failure (HCC)    1. Acute on chronic diastolic congestive heart failure, with persistent left pleural effusion, peripheral edema, BNP 739, on furosemide 40 mg daily at home, started on furosemide 40 mg IV twice daily. Renal function stable. Potassium mildly low. 2.  COPD, on 3 L O2 3.  Moderate left pleural effusion, status postthoracentesis 01/11/2021, scheduled for repeat thoracentesis 02/26/2021 4.  Chronic atrial fibrillation, rate controlled on metoprolol tartrate, not anticoagulated due to advanced age, risk of falling, patient and family preference  Recommendations: Proceed with thoracentesis today Continue metoprolol for rate control Defer chronic anticoagulation due to above Continue diuresis with careful monitoring of renal status  Leanora Ivanoff, PA-C 02/26/2021 9:05 AM

## 2021-02-26 NOTE — Procedures (Signed)
PROCEDURE SUMMARY:  Successful image-guided left thoracentesis. Yielded 1.2 liters of clear, golden fluid. Patient tolerated procedure well. EBL < 76mL No immediate complications.  Specimen was not sent for labs. Post procedure CXR shows no pneumothorax.  Please see imaging section of Epic for full dictation.  Villa Herb PA-C 02/26/2021 1:29 PM

## 2021-02-26 NOTE — Progress Notes (Addendum)
PROGRESS NOTE    Brian Lara  PHX:[id number] DOB: July 29, 1926 DOA: 02/24/2021 PCP: Mick Sell, MD    Brief Narrative:  Brian Lara is a 87 y.o. male with medical history significant for atrial fibrillation not on anticoagulation, COPD with chronic respiratory failure on 4 L of oxygen, history of chronic diastolic dysfunction CHF with last known LVEF of 55% from 06/22, hypertension and dyslipidemia who was brought into the ER by EMS for evaluation of shortness of breath. Patient was recently hospitalized and is status post left-sided thoracentesis with improvement in his symptoms.  He is scheduled for repeat thoracentesis on 02/26/21 but ended up in the emergency room. 7/25 patient reports his breathing is mildly labored this a.m.  Waiting for thoracentesis  Consultants:  Cardiology  Procedures:   Antimicrobials:      Subjective: Feels short of breath.  No chest pain  Objective: Vitals:   02/26/21 0750 02/26/21 1218 02/26/21 1313 02/26/21 1330  BP: (!) 168/88 (!) 167/82 122/82 106/61  Pulse: 64 65 (!) 54 60  Resp: 16 16    Temp: (!) 97.5 F (36.4 C) 98.2 F (36.8 C)    TempSrc:      SpO2: 93% 94% 93% 98%  Weight:        Intake/Output Summary (Last 24 hours) at 02/26/2021 1434 Last data filed at 02/26/2021 1211 Gross per 24 hour  Intake 480 ml  Output 1350 ml  Net -870 ml   Filed Weights   02/26/21 0419  Weight: 84 kg    Examination:  General exam: Appears calm and comfortable  Respiratory system: Decreased breath sounds at bases Cardiovascular system: S1 & S2 heard, RRR. No gallop  Gastrointestinal system: Abdomen is nondistended, soft and nontender. Normal bowel sounds heard. Central nervous system: Alert and oriented.  Appears grossly intact Extremities: Mild edema lower extremity Skin: Warm dry Psychiatry: Judgement and insight appear normal. Mood & affect appropriate.     Data Reviewed: I have personally reviewed following labs and  imaging studies  CBC: Recent Labs  Lab 02/24/21 0757 02/26/21 0452  WBC 9.6 8.6  NEUTROABS 5.9  --   HGB 12.4* 12.5*  HCT 39.7 39.3  MCV 98.5 96.8  PLT 189 183   Basic Metabolic Panel: Recent Labs  Lab 02/24/21 0757 02/25/21 0613 02/26/21 0452  NA 138 141 137  K 3.5 3.4* 3.4*  CL 88* 89* 86*  CO2 37* 40* 41*  GLUCOSE 125* 100* 98  BUN 11 11 13   CREATININE 0.81 0.84 0.77  CALCIUM 9.0 8.9 8.9  MG  --   --  1.8   GFR: Estimated Creatinine Clearance: 61.1 mL/min (by C-G formula based on SCr of 0.77 mg/dL). Liver Function Tests: Recent Labs  Lab 02/24/21 0757  AST 22  ALT 14  ALKPHOS 81  BILITOT 1.0  PROT 7.2  ALBUMIN 3.4*   No results for input(s): LIPASE, AMYLASE in the last 168 hours. No results for input(s): AMMONIA in the last 168 hours. Coagulation Profile: Recent Labs  Lab 02/24/21 0757  INR 1.1   Cardiac Enzymes: No results for input(s): CKTOTAL, CKMB, CKMBINDEX, TROPONINI in the last 168 hours. BNP (last 3 results) No results for input(s): PROBNP in the last 8760 hours. HbA1C: No results for input(s): HGBA1C in the last 72 hours. CBG: No results for input(s): GLUCAP in the last 168 hours. Lipid Profile: No results for input(s): CHOL, HDL, LDLCALC, TRIG, CHOLHDL, LDLDIRECT in the last 72 hours. Thyroid Function Tests: No results  for input(s): TSH, T4TOTAL, FREET4, T3FREE, THYROIDAB in the last 72 hours. Anemia Panel: No results for input(s): VITAMINB12, FOLATE, FERRITIN, TIBC, IRON, RETICCTPCT in the last 72 hours. Sepsis Labs: Recent Labs  Lab 02/25/21 [id number]  PROCALCITON <0.10    Recent Results (from the past 240 hour(s))  Resp Panel by RT-PCR (Flu A&B, Covid) Nasopharyngeal Swab     Status: None   Collection Time: 02/24/21  7:57 AM   Specimen: Nasopharyngeal Swab; Nasopharyngeal(NP) swabs in vial transport medium  Result Value Ref Range Status   SARS Coronavirus 2 by RT PCR NEGATIVE NEGATIVE Final    Comment: (NOTE) SARS-CoV-2 target  nucleic acids are NOT DETECTED.  The SARS-CoV-2 RNA is generally detectable in upper respiratory specimens during the acute phase of infection. The lowest concentration of SARS-CoV-2 viral copies this assay can detect is 138 copies/mL. A negative result does not preclude SARS-Cov-2 infection and should not be used as the sole basis for treatment or other patient management decisions. A negative result may occur with  improper specimen collection/handling, submission of specimen other than nasopharyngeal swab, presence of viral mutation(s) within the areas targeted by this assay, and inadequate number of viral copies(<138 copies/mL). A negative result must be combined with clinical observations, patient history, and epidemiological information. The expected result is Negative.  Fact Sheet for Patients:  BloggerCourse.comhttps://www.fda.gov/media/152166/download  Fact Sheet for Healthcare Providers:  SeriousBroker.ithttps://www.fda.gov/media/152162/download  This test is no t yet approved or cleared by the [place] FDA and  has been authorized for detection and/or diagnosis of SARS-CoV-2 by FDA under an Emergency Use Authorization (EUA). This EUA will remain  in effect (meaning this test can be used) for the duration of the COVID-19 declaration under Section 564(b)(1) of the Act, 21 U.S.C.section 360bbb-3(b)(1), unless the authorization is terminated  or revoked sooner.       Influenza A by PCR NEGATIVE NEGATIVE Final   Influenza B by PCR NEGATIVE NEGATIVE Final    Comment: (NOTE) The Xpert Xpress SARS-CoV-2/FLU/RSV plus assay is intended as an aid in the diagnosis of influenza from Nasopharyngeal swab specimens and should not be used as a sole basis for treatment. Nasal washings and aspirates are unacceptable for Xpert Xpress SARS-CoV-2/FLU/RSV testing.  Fact Sheet for Patients: BloggerCourse.comhttps://www.fda.gov/media/152166/download  Fact Sheet for Healthcare  Providers: SeriousBroker.ithttps://www.fda.gov/media/152162/download  This test is not yet approved or cleared by the [place] FDA and has been authorized for detection and/or diagnosis of SARS-CoV-2 by FDA under an Emergency Use Authorization (EUA). This EUA will remain in effect (meaning this test can be used) for the duration of the COVID-19 declaration under Section 564(b)(1) of the Act, 21 U.S.C. section 360bbb-3(b)(1), unless the authorization is terminated or revoked.  Performed at [facility], [address] [place], [place] [id number]          Radiology Studies: US THORACENTESIS [place] PLEURAL SPACE W/IMG GUIDE  Result Date: 02/26/2021 INDICATION: Patient history of congestive heart failure, COPD, recurrent bilateral pleural effusions. Request to IR for therapeutic left thoracentesis. EXAM: ULTRASOUND GUIDED LEFT THORACENTESIS MEDICATIONS: 8 mL 1% lidocaine COMPLICATIONS: None immediate. PROCEDURE: An ultrasound guided thoracentesis was thoroughly discussed with the patient and questions answered. The benefits, risks, alternatives and complications were also discussed. The patient understands and wishes to proceed with the procedure. Written consent was obtained. Ultrasound was performed to localize and mark an adequate pocket of fluid in the left chest. The area was then prepped and draped in the normal sterile fashion. 1% Lidocaine was used for local  anesthesia. Under ultrasound guidance a 6 Fr Safe-T-Centesis catheter was introduced. Thoracentesis was performed. The catheter was removed and a dressing applied. FINDINGS: A total of approximately 1.2 L of clear, golden fluid was removed. IMPRESSION: Successful ultrasound guided left thoracentesis yielding 1.2 L of pleural fluid. Read by Lynnette Caffey, PA-C Electronically Signed   By: Richarda Overlie M.D.   On: 02/26/2021 13:38        Scheduled Meds:  amLODipine  10 mg Oral Daily   cholecalciferol  1,000 Units Oral Daily    citalopram  10 mg Oral Daily   enoxaparin (LOVENOX) injection  40 mg Subcutaneous Q24H   finasteride  5 mg Oral Daily   fluticasone  1 spray Each Nare Daily   furosemide  40 mg Intravenous BID   gabapentin  100 mg Oral BID   loratadine  10 mg Oral Daily   metoprolol tartrate  50 mg Oral BID   mirtazapine  15 mg Oral QHS   multivitamin with minerals  1 tablet Oral Daily   omega-3 acid ethyl esters  1 g Oral BID   psyllium  1 packet Oral Daily   sodium chloride flush  3 mL Intravenous Q12H   Continuous Infusions:  sodium chloride      Assessment & Plan:   Principal Problem:   Acute on chronic diastolic CHF (congestive heart failure) (HCC) Active Problems:   Hypertension   Atrial fibrillation (HCC)   Depression   CKD (chronic kidney disease), stage IIIa   Chronic respiratory failure (HCC)   Acute on chronic diastolic CHF Acute on chronic hypoxic respiratory failure Chronic hypoxic respiratory failure on 4L O2 --chest x-ray shows pulmonary edema.  BNP is elevated at greater than 700 compared to 300 during his last hospitalization. --Family claimed that outpatient cardiologist/PCP did not want pt to receive more lasix than his home dose of oral lasix 40 mg daily, therefore was initially disapproving of the increase in diuretic therapy. 7/25 still symptomatic Continue Lasix I's and O's Cardiology following Monitor renal function and electrolytes Wean down on oxygen after thoracentesis if tolerates  Left pleural effusion --with left lower lobe collapse. --last thoracentesis in June, appeared transudate.   --family was told and expected thoracentesis would be performed upon arrival to the floor, however, non-emergent thora can not be performed on weekends due to no IR available. 7/25 plan for thoracentesis today No need for fluid studies  Hypokalemia-mild K3.4 On diuretics IV We will replace and monitor   Chronic atrial fibrillation Rate controlled on metoprolol Not on  long-term anticoagulation due to increased fall risk, however, on ASA 325 mg daily 7/25 holding aspirin due to thoracentesis.  Resume when cleared by IR postprocedure Continue metoprolol    HTN Continue amlodipine and metoprolol On IV Lasix    Depression Continue mirtazapine and citalopram   Obesity  Complicates overall prognosis and care   COPD with chronic respiratory failure Not acutely exacerbated --not on daily bronchodilators     DVT prophylaxis: Lovenox Code Status: DNR Family Communication: None at bedside Disposition Plan:  Status is: Inpatient  Remains inpatient appropriate because:Inpatient level of care appropriate due to severity of illness  Dispo: The patient is from: Home              Anticipated d/c is to: Home              Patient currently is not medically stable to d/c.   Difficult to place patient No  LOS: 2 days   Time spent: 35 minutes with more than 50% on COC    Lynn Ito, MD [facility] Pager [phone]  If 7PM-7AM, please contact night-coverage 02/26/2021, 2:34 PM

## 2021-02-27 DIAGNOSIS — I509 Heart failure, unspecified: Secondary | ICD-10-CM

## 2021-02-27 DIAGNOSIS — N1831 Chronic kidney disease, stage 3a: Secondary | ICD-10-CM | POA: Diagnosis not present

## 2021-02-27 DIAGNOSIS — I4821 Permanent atrial fibrillation: Secondary | ICD-10-CM | POA: Diagnosis not present

## 2021-02-27 DIAGNOSIS — Z515 Encounter for palliative care: Secondary | ICD-10-CM

## 2021-02-27 DIAGNOSIS — J90 Pleural effusion, not elsewhere classified: Secondary | ICD-10-CM

## 2021-02-27 DIAGNOSIS — Z7189 Other specified counseling: Secondary | ICD-10-CM

## 2021-02-27 DIAGNOSIS — Z66 Do not resuscitate: Secondary | ICD-10-CM

## 2021-02-27 DIAGNOSIS — I5033 Acute on chronic diastolic (congestive) heart failure: Secondary | ICD-10-CM | POA: Diagnosis not present

## 2021-02-27 LAB — CBC
HCT: 37.2 % — ABNORMAL LOW (ref 39.0–52.0)
Hemoglobin: 12.1 g/dL — ABNORMAL LOW (ref 13.0–17.0)
MCH: 31.6 pg (ref 26.0–34.0)
MCHC: 32.5 g/dL (ref 30.0–36.0)
MCV: 97.1 fL (ref 80.0–100.0)
Platelets: 180 10*3/uL (ref 150–400)
RBC: 3.83 MIL/uL — ABNORMAL LOW (ref 4.22–5.81)
RDW: 13.5 % (ref 11.5–15.5)
WBC: 8.6 10*3/uL (ref 4.0–10.5)
nRBC: 0 % (ref 0.0–0.2)

## 2021-02-27 LAB — BASIC METABOLIC PANEL
Anion gap: 10 (ref 5–15)
BUN: 15 mg/dL (ref 8–23)
CO2: 43 mmol/L — ABNORMAL HIGH (ref 22–32)
Calcium: 8.8 mg/dL — ABNORMAL LOW (ref 8.9–10.3)
Chloride: 83 mmol/L — ABNORMAL LOW (ref 98–111)
Creatinine, Ser: 0.75 mg/dL (ref 0.61–1.24)
GFR, Estimated: >60 mL/min (ref >60)
Glucose, Bld: 98 mg/dL (ref 70–99)
Potassium: 3.4 mmol/L — ABNORMAL LOW (ref 3.5–5.1)
Sodium: 136 mmol/L (ref 135–145)

## 2021-02-27 LAB — MAGNESIUM: Magnesium: 1.7 mg/dL (ref 1.7–2.4)

## 2021-02-27 LAB — BRAIN NATRIURETIC PEPTIDE: B Natriuretic Peptide: 421.9 pg/mL — ABNORMAL HIGH (ref 0.0–100.0)

## 2021-02-27 MED ORDER — ASPIRIN EC 325 MG PO TBEC
325.0000 mg | DELAYED_RELEASE_TABLET | Freq: Every day | ORAL | Status: DC
  Administered 2021-02-28: 325 mg via ORAL
  Filled 2021-02-27: qty 1

## 2021-02-27 MED ORDER — POTASSIUM CHLORIDE CRYS ER 20 MEQ PO TBCR
40.0000 meq | EXTENDED_RELEASE_TABLET | Freq: Once | ORAL | Status: AC
  Administered 2021-02-27: 40 meq via ORAL
  Filled 2021-02-27: qty 2

## 2021-02-27 MED ORDER — POTASSIUM CHLORIDE CRYS ER 20 MEQ PO TBCR
40.0000 meq | EXTENDED_RELEASE_TABLET | Freq: Every day | ORAL | Status: DC
  Administered 2021-02-27: 40 meq via ORAL
  Filled 2021-02-27: qty 2

## 2021-02-27 NOTE — Progress Notes (Signed)
[facility]    SUBJECTIVE: The patient reports feeling marginally better, not back to his baseline in terms of breathing. He denies chest pain.   Vitals:   02/27/21 0423 02/27/21 0600 02/27/21 0736 02/27/21 1212  BP: (!) 153/88  (!) 152/74 (!) 110/50  Pulse: 61  63 63  Resp: 17  18 17   Temp: 98.4 F (36.9 C)  98 F (36.7 C) 98 F (36.7 C)  TempSrc:      SpO2: 94%  94% 94%  Weight:  81.8 kg       Intake/Output Summary (Last 24 hours) at 02/27/2021 1216 Last data filed at 02/27/2021 0600 Gross per 24 hour  Intake 420 ml  Output 1750 ml  Net -1330 ml      PHYSICAL EXAM  General: Frail elderly gentleman lying in bed in no acute distress HEENT:  Normocephalic and atramatic Neck:  No JVD.  Lungs: decreased basilar breath sounds, normal effort of breathing on suppl O2 Heart: HRRR . Normal S1 and S2 without gallops or murmurs.  Msk:  Back normal. Normal strength and tone for age. Extremities: No clubbing, cyanosis or edema.   Neuro: Alert and oriented X 3. Psych:  Good affect, responds appropriately   LABS: Basic Metabolic Panel: Recent Labs    02/26/21 0452 02/27/21 0539  NA 137 136  K 3.4* 3.4*  CL 86* 83*  CO2 41* 43*  GLUCOSE 98 98  BUN 13 15  CREATININE 0.77 0.75  CALCIUM 8.9 8.8*  MG 1.8 1.7   Liver Function Tests: No results for input(s): AST, ALT, ALKPHOS, BILITOT, PROT, ALBUMIN in the last 72 hours. No results for input(s): LIPASE, AMYLASE in the last 72 hours. CBC: Recent Labs    02/26/21 0452 02/27/21 0539  WBC 8.6 8.6  HGB 12.5* 12.1*  HCT 39.3 37.2*  MCV 96.8 97.1  PLT 183 180   Cardiac Enzymes: No results for input(s): CKTOTAL, CKMB, CKMBINDEX, TROPONINI in the last 72 hours. BNP: Invalid input(s): POCBNP D-Dimer: No results for input(s): DDIMER in the last 72 hours. Hemoglobin A1C: No results for input(s): HGBA1C in the last 72 hours. Fasting Lipid Panel: No results for input(s): CHOL, HDL, LDLCALC, TRIG, CHOLHDL, LDLDIRECT in  the last 72 hours. Thyroid Function Tests: No results for input(s): TSH, T4TOTAL, T3FREE, THYROIDAB in the last 72 hours.  Invalid input(s): FREET3 Anemia Panel: No results for input(s): VITAMINB12, FOLATE, FERRITIN, TIBC, IRON, RETICCTPCT in the last 72 hours.  DG Chest Port 1 View  Result Date: 02/26/2021 CLINICAL DATA:  Post left-sided thoracentesis. EXAM: PORTABLE CHEST 1 VIEW COMPARISON:  02/24/2021; 01/12/2021; chest CT-02/24/2021 FINDINGS: Interval reduction in persistent small left-sided pleural effusion post thoracentesis. No pneumothorax. Unchanged small right-sided pleural effusion. Improved aeration of left lung base with persistent bibasilar heterogeneous/consolidative opacities, left greater than right. Pulmonary vasculature remains indistinct with cephalization of flow. No acute osseous abnormalities. IMPRESSION: 1. Interval reduction in persistent small left-sided effusion post thoracentesis. No pneumothorax. 2. Similar findings of cardiomegaly, pulmonary edema and small right-sided pleural effusion. 3. Minimally improved aeration of left lung base with persistent bibasilar heterogeneous opacities, atelectasis versus infiltrate. Electronically Signed   By: 02/26/2021 M.D.   On: 02/26/2021 14:32   02/28/2021 THORACENTESIS ASP PLEURAL SPACE W/IMG GUIDE  Result Date: 02/26/2021 INDICATION: Patient history of congestive heart failure, COPD, recurrent bilateral pleural effusions. Request to IR for therapeutic left thoracentesis. EXAM: ULTRASOUND GUIDED LEFT THORACENTESIS MEDICATIONS: 8 mL 1% lidocaine COMPLICATIONS: None immediate. PROCEDURE: An ultrasound guided thoracentesis was  thoroughly discussed with the patient and questions answered. The benefits, risks, alternatives and complications were also discussed. The patient understands and wishes to proceed with the procedure. Written consent was obtained. Ultrasound was performed to localize and mark an adequate pocket of fluid in the left chest.  The area was then prepped and draped in the normal sterile fashion. 1% Lidocaine was used for local anesthesia. Under ultrasound guidance a 6 Fr Safe-T-Centesis catheter was introduced. Thoracentesis was performed. The catheter was removed and a dressing applied. FINDINGS: A total of approximately 1.2 L of clear, golden fluid was removed. IMPRESSION: Successful ultrasound guided left thoracentesis yielding 1.2 L of pleural fluid. Read by Lynnette Caffey, PA-C Electronically Signed   By: Richarda Overlie M.D.   On: 02/26/2021 13:38     Echo LVEF 50-55%, mild to moderate MR    ASSESSMENT AND PLAN:  Principal Problem:   Acute on chronic diastolic CHF (congestive heart failure) (HCC) Active Problems:   Hypertension   Atrial fibrillation (HCC)   Depression   CKD (chronic kidney disease), stage IIIa   Chronic respiratory failure (HCC)   1. Acute on chronic diastolic congestive heart failure, with persistent left pleural effusion, peripheral edema, BNP 739, on furosemide 40 mg daily at home, started on furosemide 40 mg IV twice daily. Renal function stable. Potassium mildly low. 2.  COPD, on 3 L O2 3.  Moderate left pleural effusion, status postthoracentesis 01/11/2021, status post repeat thoracentesis 02/26/2021, yielding 1.2L  4.  Chronic atrial fibrillation, rate controlled on metoprolol tartrate, not anticoagulated due to advanced age, risk of falling, patient and family preference  Recommendations: Continue diuresis with careful monitoring of renal status and electrolytes Defer chronic anticoagulation per family and patient preference Continue to supplement K 4.   Await palliative care recommendations  Leanora Ivanoff, Cordelia Poche 02/27/2021 12:16 PM

## 2021-02-27 NOTE — Progress Notes (Signed)
PROGRESS NOTE    Brian Lara  MBE:[id number] DOB: [birth year] DOA: 02/24/2021 PCP: Mick Sell, MD    Brief Narrative:  Brian Lara is a 87 y.o. male with medical history significant for atrial fibrillation not on anticoagulation, COPD with chronic respiratory failure on 4 L of oxygen, history of chronic diastolic dysfunction CHF with last known LVEF of 55% from 06/22, hypertension and dyslipidemia who was brought into the ER by EMS for evaluation of shortness of breath. Patient was recently hospitalized and is status post left-sided thoracentesis with improvement in his symptoms.  He is scheduled for repeat thoracentesis on 02/26/21 but ended up in the emergency room.  7/26 s/p left thoracentesis on 7/25, yielded 1.2 L of clear golden fluid.  Chest x-ray no pneumothorax postprocedure.   Consultants:  Cardiology  Procedures:   Antimicrobials:      Subjective: Feels his shortness of breath improved some since thoracentesis.  No chest pain.  Less tachypneic  Objective: Vitals:   02/27/21 0423 02/27/21 0600 02/27/21 0736 02/27/21 1212  BP: (!) 153/88  (!) 152/74 (!) 110/50  Pulse: 61  63 63  Resp: 17  18 17   Temp: 98.4 F (36.9 C)  98 F (36.7 C) 98 F (36.7 C)  TempSrc:      SpO2: 94%  94% 94%  Weight:  81.8 kg      Intake/Output Summary (Last 24 hours) at 02/27/2021 1326 Last data filed at 02/27/2021 0600 Gross per 24 hour  Intake 420 ml  Output 1750 ml  Net -1330 ml   Filed Weights   02/26/21 0419 02/27/21 0600  Weight: 84 kg 81.8 kg    Examination: Nad, non tachypnic Decreased breath sounds at bases, no wheeze scattered Rales Regular S1-S2 no gallops Soft benign positive bowel sounds Mild lower extremity edema Aaxox4 grossly intact  Mood and affect appropriate for current setting    Data Reviewed: I have personally reviewed following labs and imaging studies  CBC: Recent Labs  Lab 02/24/21 0757 02/26/21 0452 02/27/21 0539  WBC  9.6 8.6 8.6  NEUTROABS 5.9  --   --   HGB 12.4* 12.5* 12.1*  HCT 39.7 39.3 37.2*  MCV 98.5 96.8 97.1  PLT 189 183 180   Basic Metabolic Panel: Recent Labs  Lab 02/24/21 0757 02/25/21 0613 02/26/21 0452 02/27/21 0539  NA 138 141 137 136  K 3.5 3.4* 3.4* 3.4*  CL 88* 89* 86* 83*  CO2 37* 40* 41* 43*  GLUCOSE 125* 100* 98 98  BUN 11 11 13 15   CREATININE 0.81 0.84 0.77 0.75  CALCIUM 9.0 8.9 8.9 8.8*  MG  --   --  1.8 1.7   GFR: Estimated Creatinine Clearance: 60.3 mL/min (by C-G formula based on SCr of 0.75 mg/dL). Liver Function Tests: Recent Labs  Lab 02/24/21 0757  AST 22  ALT 14  ALKPHOS 81  BILITOT 1.0  PROT 7.2  ALBUMIN 3.4*   No results for input(s): LIPASE, AMYLASE in the last 168 hours. No results for input(s): AMMONIA in the last 168 hours. Coagulation Profile: Recent Labs  Lab 02/24/21 0757  INR 1.1   Cardiac Enzymes: No results for input(s): CKTOTAL, CKMB, CKMBINDEX, TROPONINI in the last 168 hours. BNP (last 3 results) No results for input(s): PROBNP in the last 8760 hours. HbA1C: No results for input(s): HGBA1C in the last 72 hours. CBG: No results for input(s): GLUCAP in the last 168 hours. Lipid Profile: No results for input(s): CHOL, HDL, LDLCALC,  TRIG, CHOLHDL, LDLDIRECT in the last 72 hours. Thyroid Function Tests: No results for input(s): TSH, T4TOTAL, FREET4, T3FREE, THYROIDAB in the last 72 hours. Anemia Panel: No results for input(s): VITAMINB12, FOLATE, FERRITIN, TIBC, IRON, RETICCTPCT in the last 72 hours. Sepsis Labs: Recent Labs  Lab 02/25/21 [id number]  PROCALCITON <0.10    Recent Results (from the past 240 hour(s))  Resp Panel by RT-PCR (Flu A&B, Covid) Nasopharyngeal Swab     Status: None   Collection Time: 02/24/21  7:57 AM   Specimen: Nasopharyngeal Swab; Nasopharyngeal(NP) swabs in vial transport medium  Result Value Ref Range Status   SARS Coronavirus 2 by RT PCR NEGATIVE NEGATIVE Final    Comment: (NOTE) SARS-CoV-2  target nucleic acids are NOT DETECTED.  The SARS-CoV-2 RNA is generally detectable in upper respiratory specimens during the acute phase of infection. The lowest concentration of SARS-CoV-2 viral copies this assay can detect is 138 copies/mL. A negative result does not preclude SARS-Cov-2 infection and should not be used as the sole basis for treatment or other patient management decisions. A negative result may occur with  improper specimen collection/handling, submission of specimen other than nasopharyngeal swab, presence of viral mutation(s) within the areas targeted by this assay, and inadequate number of viral copies(<138 copies/mL). A negative result must be combined with clinical observations, patient history, and epidemiological information. The expected result is Negative.  Fact Sheet for Patients:  BloggerCourse.comhttps://www.fda.gov/media/152166/download  Fact Sheet for Healthcare Providers:  SeriousBroker.ithttps://www.fda.gov/media/152162/download  This test is no t yet approved or cleared by the [place] FDA and  has been authorized for detection and/or diagnosis of SARS-CoV-2 by FDA under an Emergency Use Authorization (EUA). This EUA will remain  in effect (meaning this test can be used) for the duration of the COVID-19 declaration under Section 564(b)(1) of the Act, 21 U.S.C.section 360bbb-3(b)(1), unless the authorization is terminated  or revoked sooner.       Influenza A by PCR NEGATIVE NEGATIVE Final   Influenza B by PCR NEGATIVE NEGATIVE Final    Comment: (NOTE) The Xpert Xpress SARS-CoV-2/FLU/RSV plus assay is intended as an aid in the diagnosis of influenza from Nasopharyngeal swab specimens and should not be used as a sole basis for treatment. Nasal washings and aspirates are unacceptable for Xpert Xpress SARS-CoV-2/FLU/RSV testing.  Fact Sheet for Patients: BloggerCourse.comhttps://www.fda.gov/media/152166/download  Fact Sheet for Healthcare  Providers: SeriousBroker.ithttps://www.fda.gov/media/152162/download  This test is not yet approved or cleared by the [place] FDA and has been authorized for detection and/or diagnosis of SARS-CoV-2 by FDA under an Emergency Use Authorization (EUA). This EUA will remain in effect (meaning this test can be used) for the duration of the COVID-19 declaration under Section 564(b)(1) of the Act, 21 U.S.C. section 360bbb-3(b)(1), unless the authorization is terminated or revoked.  Performed at [facility], [address] [place], [place] [address]          Radiology [facility] [place] [facility]  Result Date: 02/26/2021 CLINICAL DATA:  Post left-sided thoracentesis. EXAM: PORTABLE CHEST 1 VIEW COMPARISON:  02/24/2021; 01/12/2021; chest CT-02/24/2021 FINDINGS: Interval reduction in persistent small left-sided pleural effusion post thoracentesis. No pneumothorax. Unchanged small right-sided pleural effusion. Improved aeration of left lung base with persistent bibasilar heterogeneous/consolidative opacities, left greater than right. Pulmonary vasculature remains indistinct with cephalization of flow. No acute osseous abnormalities. IMPRESSION: 1. Interval reduction in persistent small left-sided effusion post thoracentesis. No pneumothorax. 2. Similar findings of cardiomegaly, pulmonary edema and small right-sided pleural effusion. 3. Minimally improved aeration of  left lung base with persistent bibasilar heterogeneous opacities, atelectasis versus infiltrate. Electronically Signed   By: Simonne Come M.D.   On: 02/26/2021 14:32   US THORACENTESIS ASP PLEURAL SPACE W/IMG GUIDE  Result Date: 02/26/2021 INDICATION: Patient history of congestive heart failure, COPD, recurrent bilateral pleural effusions. Request to IR for therapeutic left thoracentesis. EXAM: ULTRASOUND GUIDED LEFT THORACENTESIS MEDICATIONS: 8 mL 1% lidocaine COMPLICATIONS: None immediate. PROCEDURE: An ultrasound guided thoracentesis  was thoroughly discussed with the patient and questions answered. The benefits, risks, alternatives and complications were also discussed. The patient understands and wishes to proceed with the procedure. Written consent was obtained. Ultrasound was performed to localize and mark an adequate pocket of fluid in the left chest. The area was then prepped and draped in the normal sterile fashion. 1% Lidocaine was used for local anesthesia. Under ultrasound guidance a 6 Fr Safe-T-Centesis catheter was introduced. Thoracentesis was performed. The catheter was removed and a dressing applied. FINDINGS: A total of approximately 1.2 L of clear, golden fluid was removed. IMPRESSION: Successful ultrasound guided left thoracentesis yielding 1.2 L of pleural fluid. Read by Lynnette Caffey, PA-C Electronically Signed   By: Richarda Overlie M.D.   On: 02/26/2021 13:38        Scheduled Meds:  amLODipine  10 mg Oral Daily   cholecalciferol  1,000 Units Oral Daily   citalopram  10 mg Oral Daily   enoxaparin (LOVENOX) injection  40 mg Subcutaneous Q24H   finasteride  5 mg Oral Daily   fluticasone  1 spray Each Nare Daily   furosemide  40 mg Intravenous BID   gabapentin  100 mg Oral BID   loratadine  10 mg Oral Daily   metoprolol tartrate  50 mg Oral BID   mirtazapine  15 mg Oral QHS   multivitamin with minerals  1 tablet Oral Daily   omega-3 acid ethyl esters  1 g Oral BID   potassium chloride  40 mEq Oral Daily   psyllium  1 packet Oral Daily   sodium chloride flush  3 mL Intravenous Q12H   Continuous Infusions:  sodium chloride      Assessment & Plan:   Principal Problem:   Acute on chronic diastolic CHF (congestive heart failure) (HCC) Active Problems:   Hypertension   Atrial fibrillation (HCC)   Depression   CKD (chronic kidney disease), stage IIIa   Chronic respiratory failure (HCC)   Acute on chronic diastolic CHF Acute on chronic hypoxic respiratory failure Chronic hypoxic respiratory  failure on 4L O2 --chest x-ray shows pulmonary edema.  BNP is elevated at greater than 700 compared to 300 during his last hospitalization. --Family claimed that outpatient cardiologist/PCP did not want pt to receive more lasix than his home dose of oral lasix 40 mg daily, therefore was initially disapproving of the increase in diuretic therapy. 7/26 improved some after thoracentesis still not at baseline Will continue on IV Lasix Cardiology following Monitor renal function and electrolytes Currently on 5 L nasal cannula, wean to baseline as tolerated Discussed his current status from heart failure and COPD standpoint with him and his granddaughter.  Both agreed to palliative care.  He is at risk for readmission.   Left pleural effusion --with left lower lobe collapse. --last thoracentesis in June, appeared transudate.   --family was told and expected thoracentesis would be performed upon arrival to the floor, however, non-emergent thora can not be performed on weekends due to no IR available. 7/26 status post left thoracentesis on 7/25  Chest x-ray postprocedure no pneumothorax No fluid studies done as previous thoracentesis was consistent with CHF  Hypokalemia-mild We will replace with KCl p.o. Also added 40 mEq KCl daily since he is on IV Lasix reevaluate when off of Lasix.   Chronic atrial fibrillation Rate controlled on metoprolol Not on long-term anticoagulation due to increased fall risk, however, on ASA 325 mg daily 7/26 aspirin was held due to thoracentesis.  Will resume.   Continue metoprolol    HTN Continue amlodipine and metoprolol On IV Lasix    Depression Continue mirtazapine and citalopram   Obesity  Complicates overall prognosis and care   COPD with chronic respiratory failure Not acutely exacerbated --not on daily bronchodilators     DVT prophylaxis: Lovenox Code Status: DNR Family Communication: GD at bedside Disposition Plan:  Status is:  Inpatient  Remains inpatient appropriate because:Inpatient level of care appropriate due to severity of illness  Dispo: The patient is from: Home              Anticipated d/c is to: Home              Patient currently is not medically stable to d/c.   Difficult to place patient No            LOS: 3 days   Time spent: 35 minutes with more than 50% on COC    Lynn Ito, MD [facility] Pager [phone]  If 7PM-7AM, please contact night-coverage 02/27/2021, 1:26 PM

## 2021-02-27 NOTE — Care Management Important Message (Signed)
Important Message  Patient Details  Name: Brian Lara MRN: [id number] Date of Birth: [birth year]   Medicare Important Message Given:  Yes     [name] 02/27/2021, 4:38 PM

## 2021-02-27 NOTE — Consult Note (Signed)
Palliative Care Consult Note                                  Date: 02/27/2021   Patient Name: Brian Lara  DOB: [birth year]  MRN: [id number]  Age / Sex: 87 y.o., male  PCP: Leonel Ramsay, MD Referring Physician: Nolberto Hanlon, MD  Reason for Consultation: Establishing goals of care  HPI/Patient Profile: Palliative Care consult requested for goals of care discussion in this 87 y.o. male  with past medical history of atrial fibrillation (no anticoagulant), COPD (4L), hypertension, CHF, and hyperlipidemia. He was admitted on 02/24/2021 from home at [facility] independent living with complaints of worsening shortness of breath. Of note patient recently hospitalized s/p left thoracentesis (1.2L). He was scheduled for repeat thoracentesis on 7/25 however was hospitalized. Since admission chest x-ray showed left lung collapse/consolidation with diffuse interstitial edema. Moderate left pleural effusion. S/p thoracentesis (1.2L) on 02/26/21. He is being followed by Cardiology.   Past Medical History:  Diagnosis Date  . Atrial fibrillation ([place])   . COPD (chronic obstructive pulmonary disease) ([place])   . Hypercholesteremia   . Hypertension      Subjective:   This NP Osborne Oman reviewed medical records, received report from team, assessed the patient and then met at the patient's bedside with Mr. Brian Lara and his granddaughter, Brian Lara to discuss diagnosis, prognosis, GOC, EOL wishes disposition and options.  Patient is awake, alert and oriented x3. Denies pain. States his breathing is a little better, not at baseline and worsens with exertion.    Concept of Palliative Care was introduced as specialized medical care for people and their families living with serious illness.  It focuses on providing relief from the symptoms and stress of a serious illness.  The goal is to improve quality of life for both the patient and the family. Values  and goals of care important to patient and family were attempted to be elicited. Patient and granddaughter verbalized understanding and appreciation.   I Created space and opportunity for patient and family to explore state of health prior to admission, thoughts, and feelings. Family shares prior to admission patient lived in his apartment alone at [facility]. Granddaughter lives 5 min away and is there with him daily. She assist in meals and arranges his weekly pill box. Patient is able to independently perform most ADLs however with frequent rest breaks and fatigue over the last month. He requires assistance with showering. Ambulates in the home with a walker.   He is no longer able to socialize with his friends or ambulate to the dining area due to his health decline (shortness of breath and weakness). Appetite has decreased over the past month.   We discussed His current illness and what it means in the larger context of His on-going co-morbidities. Natural disease trajectory and expectations were discussed.  Both patient and family verbalized understanding of current illness and co-morbidities. Brian Lara is tearful expressing her understanding and watching him continue to decline. She shares she does not wish for him to suffer and speaks of the misery he was in a month ago before his first thoracentesis.   Patient shares his love for his family expressing his tiredness and understanding of worsening health. He is realistic in his understanding. He states he does not want to continue to come back and forth to the hospital, is not interested in frequent thoracentesis, and does not  wish to prolong his life. Emotional support provided.   I discussed the importance of continued conversation with family and their medical providers regarding overall plan of care and treatment options, ensuring decisions are within the context of the patients values and GOCs.  Questions and concerns were addressed. The  family was encouraged to call with questions or concerns.  PMT will continue to support holistically as needed.  Life Review: Patient is a retired Furniture conservator/restorer. Widowed with one son and one granddaughter. He enjoyed spending time with family, friends, golfing, and wood working. He is of [place] faith.    Objective:   Primary Diagnoses: Present on Admission: . Acute on chronic diastolic CHF (congestive heart failure) ([place]) . Atrial fibrillation ([place]) . Chronic respiratory failure ([place]) . CKD (chronic kidney disease), stage IIIa . Depression . Hypertension   Scheduled Meds: . amLODipine  10 mg Oral Daily  . [START ON 02/28/2021] aspirin EC  325 mg Oral Daily  . cholecalciferol  1,000 Units Oral Daily  . citalopram  10 mg Oral Daily  . enoxaparin (LOVENOX) injection  40 mg Subcutaneous Q24H  . finasteride  5 mg Oral Daily  . fluticasone  1 spray Each Nare Daily  . furosemide  40 mg Intravenous BID  . gabapentin  100 mg Oral BID  . loratadine  10 mg Oral Daily  . metoprolol tartrate  50 mg Oral BID  . mirtazapine  15 mg Oral QHS  . multivitamin with minerals  1 tablet Oral Daily  . omega-3 acid ethyl esters  1 g Oral BID  . potassium chloride  40 mEq Oral Daily  . psyllium  1 packet Oral Daily  . sodium chloride flush  3 mL Intravenous Q12H    Continuous Infusions: . sodium chloride      PRN Meds: sodium chloride, acetaminophen, butalbital-acetaminophen-caffeine, ondansetron (ZOFRAN) IV, sodium chloride flush  Allergies  Allergen Reactions  . Codeine Other (See Comments)  . Morphine And Related Other (See Comments)    Review of Systems  Constitutional:  Positive for activity change and appetite change.  Respiratory:  Positive for shortness of breath.   Cardiovascular:  Positive for leg swelling.  Neurological:  Positive for weakness.  Unless otherwise noted, a complete review of systems is negative.  Physical Exam General: NAD, frail -ill  appearing Cardiovascular:RRR Pulmonary: diminished bilaterally, 4L/Kline Abdomen: soft, nontender, + bowel sounds Extremities: trace lower extremity edema, no joint deformities Skin: no rashes, warm and dry Neurological: AAO x3, mood appropriate, follows commands   Vital Signs:  BP (!) 110/50 (BP Location: Right Arm)   Pulse 63   Temp 98 F (36.7 C)   Resp 17   Wt 81.8 kg   SpO2 94%   BMI 28.24 kg/m  Pain Scale: 0-10 POSS *See Group Information*: 1-Acceptable,Awake and alert Pain Score: 0-No pain  SpO2: SpO2: 94 % O2 Device:SpO2: 94 % O2 Flow Rate: .O2 Flow Rate (L/min): 5 L/min  IO: Intake/output summary:  Intake/Output Summary (Last 24 hours) at 02/27/2021 1416 Last data filed at 02/27/2021 1356 Gross per 24 hour  Intake 660 ml  Output 2950 ml  Net -2290 ml    LBM:   Baseline Weight: Weight: 84 kg Most recent weight: Weight: 81.8 kg      Palliative Assessment/Data: PPS 30%   Advanced Care Planning:   Primary Decision Maker: HCPOA- Brian Lara Qualls (granddaughter)   Code Status/Advance Care Planning: DNR  A discussion was had today regarding advanced directives. Concepts specific to code status,  artifical feeding and hydration, continued IV antibiotics and rehospitalization was had.    Advanced directive on file and reviewed. He confirms his granddaughter is his main medical decision maker with support form his son.   The difference between a aggressive medical intervention path and a palliative comfort care path was discussed. The MOST form was introduced and discussed. Family would like to further discuss with request to complete on tomorrow during follow-up.   Hospice and Palliative Care services outpatient were explained and offered. Patient and family verbalized their understanding and awareness of both palliative and hospice's goals and philosophy of care. We discussed at length outpatient hospice options in the home vs hospice home facility and criteria.   Mr.  Worthington is clear in his expressed goals that he is tired and realistic in the continued decline in his health. He wishes to continue to treat the treatable with no escalation of care. He states he would not want life prolonging measures, confirms DNR/DNI, no feeding tube. He is not interested in recurrent hospitalizations or having to continue with multiple thoracentesis. He states he has lived a good life however his quality is no longer sufficient and does not wish to continue much longer in current state. Granddaughter is tearful expressing she knows how he feels and supports him. Patient states he is open to hospice support with assurance of aggressive symptom management when he experiences additional respiratory events in the future.   Patient and family are leaning towards hospice however they wish to further discuss with patient's son today and request follow-up meeting tomorrow morning. Support provided and request acknowledged.    Assessment & Plan:   SUMMARY OF RECOMMENDATIONS   DNR/DNI-as confirmed by patient. Education provided on MOST form. Patient and family reviewing with request to complete tomorrow during follow-up.  Continue with current plan of care, treat the treatable no escalation of care.  Patient and granddaughter clear in expressed goals to focus on patient's comfort and eliminate any suffering. Patient states he is leaning towards hospice support as he does not wish to continue coming back and forth to the hospital or to have thoracentesis. States he is ready for end-of-life but would like to express final wishes with his son. They have requested follow-up tomorrow morning 7/27.  PMT will continue to support and follow as needed. Please call team line with urgent needs.  Symptom Management:  Per Attending  Palliative Prophylaxis:  Frequent Pain Assessment and respiratory support   Additional Recommendations (Limitations, Scope, Preferences): No Artificial Feeding, No  Hemodialysis, No Surgical Procedures, No Tracheostomy, and DNR/DNI, treat the treatable with no escalation in care  Psycho-social/Spiritual:  Desire for further Chaplaincy support: no Additional Recommendations: Education on Hospice  Prognosis:  POOR   Discharge Planning:  To Be Determined -Patient and family is leaning towards hospice.   Patient and granddaughter, Brian Lara expressed understanding and was in agreement with this plan.   Time In: 1300 Time Out: 1405 Time Total: 65 min.   Visit consisted of counseling and education dealing with the complex and emotionally intense issues of symptom management and palliative care in the setting of serious and potentially life-threatening illness.Greater than 50%  of this time was spent counseling and coordinating care related to the above assessment and plan.  Signed by:  Alda Lea, AGPCNP-BC Palliative Medicine Team  Phone: [phone] Pager: [phone] Amion: Bjorn Pippin   Thank you for allowing the Palliative Medicine Team to assist in the care of this patient. Please utilize secure chat  with additional questions, if there is no response within 30 minutes please call the above phone number. Palliative Medicine Team providers are available by phone from 7am to 5pm daily and can be reached through the team cell phone.  Should this patient require assistance outside of these hours, please call the patient's attending physician.

## 2021-02-27 NOTE — TOC Initial Note (Signed)
Transition of Care [facility]) - Initial/Assessment Note    Patient Details  Name: Brian Lara MRN: [id number] Date of Birth: 1925-11-03  Transition of Care [facility]) CM/SW Contact:    Gildardo Griffes, LCSW Phone Number: 02/27/2021, 9:56 AM  Clinical Narrative:                  CSW consulted for heart failure home health screen. Spoke with patient's granddaughter April who reports she is patient's POA. Confirmed patient's PCP is Dr. Sampson Goon and he receives his medications sometimes through [facility] mail [facility] or free standing [facility] located at [address] In [place].   Reports patient has a rolling walker and shower chair at home. Reports she would like to hold off on discussing home health services until palliative talks with her, as she states she just spoke with Dr. Marylu Lund this morning who is putting in a palliative consult.   CSW to follow up with discharge planning based on palliative recommendations.  April reports she can be reached at [phone].   Expected Discharge Plan:  (TBD) Barriers to Discharge: Continued Medical Work up   Patient Goals and CMS Choice   CMS Medicare.gov Compare Post Acute Care list provided to:: Patient Represenative (must comment) (POA April) Choice offered to / list presented to : [facility] POA / Guardian  Expected Discharge Plan and Services Expected Discharge Plan:  (TBD)       Living arrangements for the past 2 months: Single Family Home                                      Prior Living Arrangements/Services Living arrangements for the past 2 months: Single Family Home   Patient language and need for interpreter reviewed:: Yes                 Activities of Daily Living Home Assistive Devices/Equipment: Cane (specify quad or straight), Walker (specify type) ADL Screening (condition at time of admission) Patient's cognitive ability adequate to safely complete daily activities?: Yes Is the patient deaf or have difficulty  hearing?: No Does the patient have difficulty seeing, even when wearing glasses/contacts?: No Does the patient have difficulty concentrating, remembering, or making decisions?: No Patient able to express need for assistance with ADLs?: Yes Does the patient have difficulty dressing or bathing?: Yes Independently performs ADLs?: Yes (appropriate for developmental age) Does the patient have difficulty walking or climbing stairs?: Yes Weakness of Legs: Both Weakness of Arms/Hands: Both  Permission Sought/Granted      Share Information with NAME: April     Permission granted to share info w Relationship: granddaughter and POA  Permission granted to share info w Contact Information: [phone]  Emotional Assessment         Alcohol / Substance Use: Not Applicable Psych Involvement: No (comment)  Admission diagnosis:  Pleural effusion on left [J90] Acute on chronic diastolic CHF (congestive heart failure) (HCC) [I50.33] Acute on chronic congestive heart failure, unspecified heart failure type [facility]) [I50.9] Patient Active Problem List   Diagnosis Date Noted   Pressure injury of skin 01/12/2021   Acute respiratory failure (HCC) 01/11/2021   Obesity, morbid, BMI 40.0-49.9 (HCC) 01/11/2021   Chronic respiratory failure (HCC) 01/11/2021   Acute on chronic diastolic CHF (congestive heart failure) (HCC) 01/11/2021   Acute CHF (congestive heart failure) (HCC) 11/10/2020   COPD exacerbation (HCC) 11/10/2020   Hypertension  Hypercholesteremia    Atrial fibrillation (HCC)    Acute respiratory failure with hypoxia (HCC)    Depression    CKD (chronic kidney disease), stage IIIa    PCP:  Mick Sell, MD Pharmacy:   [id number] [place], [place] - [address] AT [facility] OF [address] [place] [address] Phone: [phone] Fax: [phone]  [id number] [place] [facility] - [address] [place] [address] Phone: [phone] Fax: [phone]     Social Determinants of Health (SDOH) Interventions    Readmission Risk Interventions No flowsheet data found.

## 2021-02-28 DIAGNOSIS — N1831 Chronic kidney disease, stage 3a: Secondary | ICD-10-CM | POA: Diagnosis not present

## 2021-02-28 DIAGNOSIS — I4821 Permanent atrial fibrillation: Secondary | ICD-10-CM | POA: Diagnosis not present

## 2021-02-28 DIAGNOSIS — I5033 Acute on chronic diastolic (congestive) heart failure: Secondary | ICD-10-CM | POA: Diagnosis not present

## 2021-02-28 DIAGNOSIS — J90 Pleural effusion, not elsewhere classified: Secondary | ICD-10-CM | POA: Diagnosis not present

## 2021-02-28 LAB — CBC
HCT: 37 % — ABNORMAL LOW (ref 39.0–52.0)
Hemoglobin: 11.7 g/dL — ABNORMAL LOW (ref 13.0–17.0)
MCH: 30.8 pg (ref 26.0–34.0)
MCHC: 31.6 g/dL (ref 30.0–36.0)
MCV: 97.4 fL (ref 80.0–100.0)
Platelets: 173 10*3/uL (ref 150–400)
RBC: 3.8 MIL/uL — ABNORMAL LOW (ref 4.22–5.81)
RDW: 13.4 % (ref 11.5–15.5)
WBC: 8 10*3/uL (ref 4.0–10.5)
nRBC: 0 % (ref 0.0–0.2)

## 2021-02-28 LAB — MAGNESIUM: Magnesium: 1.8 mg/dL (ref 1.7–2.4)

## 2021-02-28 LAB — BASIC METABOLIC PANEL
Anion gap: 9 (ref 5–15)
BUN: 15 mg/dL (ref 8–23)
CO2: 43 mmol/L — ABNORMAL HIGH (ref 22–32)
Calcium: 8.8 mg/dL — ABNORMAL LOW (ref 8.9–10.3)
Chloride: 85 mmol/L — ABNORMAL LOW (ref 98–111)
Creatinine, Ser: 0.82 mg/dL (ref 0.61–1.24)
GFR, Estimated: >60 mL/min (ref >60)
Glucose, Bld: 95 mg/dL (ref 70–99)
Potassium: 4.3 mmol/L (ref 3.5–5.1)
Sodium: 137 mmol/L (ref 135–145)

## 2021-02-28 MED ORDER — BIOTENE DRY MOUTH MT LIQD: 15.0000 mL | OROMUCOSAL | Status: DC | PRN

## 2021-02-28 MED ORDER — POLYVINYL ALCOHOL 1.4 % OP SOLN
1.0000 [drp] | Freq: Four times a day (QID) | OPHTHALMIC | Status: DC | PRN
  Filled 2021-02-28: qty 15

## 2021-02-28 MED ORDER — PSYLLIUM 95 % PO PACK
1.0000 | PACK | Freq: Every day | ORAL | Status: DC | PRN
  Filled 2021-02-28: qty 1

## 2021-02-28 MED ORDER — GLYCOPYRROLATE 0.2 MG/ML IJ SOLN
0.2000 mg | INTRAMUSCULAR | Status: DC | PRN
  Filled 2021-02-28: qty 1

## 2021-02-28 MED ORDER — HALOPERIDOL LACTATE 5 MG/ML IJ SOLN: 0.5000 mg | INTRAMUSCULAR | Status: DC | PRN

## 2021-02-28 MED ORDER — FLUTICASONE PROPIONATE 50 MCG/ACT NA SUSP
1.0000 | Freq: Every day | NASAL | Status: DC | PRN
  Filled 2021-02-28: qty 16

## 2021-02-28 MED ORDER — FENTANYL CITRATE (PF) 100 MCG/2ML IJ SOLN: 25.0000 ug | INTRAMUSCULAR | Status: DC | PRN

## 2021-02-28 MED ORDER — LORATADINE 10 MG PO TABS
10.0000 mg | ORAL_TABLET | Freq: Every day | ORAL | Status: DC | PRN
Start: 2021-02-28 — End: 2021-03-03

## 2021-02-28 NOTE — Progress Notes (Signed)
[facility] [employer], [employer] [facility]) Hospital Liaison RN Note  Received request from [facility], LCSW Transitions of Care [facility]) Manager for family interest in Hospice Home. Chart and patient information under review by [facility] physician. Hospice eligibility pending at this time.   Spoke with patient and his son Rory Xiang to explain services. Family and Angeline Slim, LCSW [facility] manager aware that hospital liaison will follow up tomorrow or sooner if Hospice eligibility confirmed.   Please do not hesitate to call with any hospice related questions or concerns.   Thank you for the opportunity to participate in this patient's care.   Bobbie "Einar Gip, RN, BSN [facility] Liaison [phone]

## 2021-02-28 NOTE — Progress Notes (Addendum)
Daily Progress Note   Patient Name: Brian Lara       Date: 02/28/2021 DOB: 09/07/1925  Age: 87 y.o. MRN#: [id number] Attending Physician: Wyvonnia Dusky, MD Primary Care Physician: Leonel Ramsay, MD Admit Date: 02/24/2021  Reason for Consultation/Follow-up: Establishing goals of care  Subjective: Chart Reviewed. Updates Received. Patient Assessed.   Patient somnolent but easily aroused. Denies pain. States he continues to have shortness of breath which is exacerbated even more with minimal exertion. States he feels extremely weak and awful. Support provided. Family is at the bedside-granddaughter April and his son, [name].   We reviewed previously discussed goals of care conversation. Patient is tearful expressing he knows that he is dying and does not wish to continue to live in current state and feel as though he is suffocating when he has shortness of breath events. Brian Lara states he is not interested in repetitive thoracentesis making note of 2 in the past 3 weeks. He shares although he had fluid removed 3 days prior he feels just as bad as he did prior to thoracentesis with minimal respiratory relief.   Family tearful and expressing their support of patient. Son states he has watched his father "struggle" for months and knows that he is tired and declining more each day. He and patient's granddaughter shares their full support in patient's expressed decisions to transition his care to focus on his comfort and end-of-life.  He has only been interested in a few bites and sips over the past month with little to no interest over the past week.   Detailed education provided on comfort measures while hospitalized and aggressive symptom management. Further education provided on outpatient hospice discussing returning to his apartment with their outpatient support.   Patient and family verbalized their understanding and confirms wishes for comfort care measures. Patient is  concerned with his need for symptom management and limited support given he is independent living. He shares previous experience with his wife and hospice years ago and their attempts with hospice in the home later transitioning to facility. He does not wish to return to his home tearfully expressing he knows when his breathing gets worst he may not be able to effectively managed and would not want to suffer as he passes away. Emotional support provided. Education provided on hospice home referral process.   Education provided and MOST form completed as requested. The patient and family outlined their wishes for the following treatment decisions:  Cardiopulmonary Resuscitation: Do Not Attempt Resuscitation (DNR/No CPR)  Medical Interventions: Comfort Measures: Keep clean, warm, and dry. Use medication by any route, positioning, wound care, and other measures to relieve pain and suffering. Use oxygen, suction and manual treatment of airway obstruction as needed for comfort. Do not transfer to the hospital unless comfort needs cannot be met in current location.  Antibiotics: Determine use of limitation of antibiotics when infection occurs  IV Fluids: No IV fluids (provide other measures to ensure comfort)  Feeding Tube: No feeding tube    All questions answered and support provided.    Length of Stay: 4 days  Vital Signs: BP 116/63 (BP Location: Right Arm)   Pulse (!) 48   Temp 98.3 F (36.8 C)   Resp 16   Wt 81.8 kg   SpO2 95%   BMI 28.24 kg/m  SpO2: SpO2: 95 % O2 Device: O2 Device: Nasal Cannula O2 Flow Rate: O2 Flow Rate (L/min): 4 L/min  Physical Exam: Mild distress, frail, chronically-ill Bradycardic  Diminished bilaterally, poor air movement, shortness of breath, 4L Generalized weakness, lower extremity edema Somnolent but easily aroused, AAO x3, mood appropriate    Palliative Care Assessment & Plan  HPI: Palliative Care consult requested for goals of care discussion in this  87 y.o. male  with past medical history of atrial fibrillation (no anticoagulant), COPD (4L), hypertension, CHF, and hyperlipidemia. He was admitted on 02/24/2021 from home at [facility] independent living with complaints of worsening shortness of breath. Of note patient recently hospitalized s/p left thoracentesis (1.2L). He was scheduled for repeat thoracentesis on 7/25 however was hospitalized. Since admission chest x-ray showed left lung collapse/consolidation with diffuse interstitial edema. Moderate left pleural effusion. S/p thoracentesis (1.2L) on 02/26/21. He is being followed by Cardiology.   Code Status: DNR  Goals of Care/Recommendations: Transition all care to focus on comfort as requested by patient and family All medications/interventions to be discontinued not comfort focused  Patient/family requesting residential hospice in [place]. TOC aware (referral placed) Continue Proscar and Neurontin for comfort  Fentanyl PRN for pain/air hunger/comfort (morphine/codeine allergy) Robinul PRN for excessive secretions Zofran PRN for nausea Liquifilm tears PRN for dry eyes Haldol PRN for agitation/anxiety May have comfort feeding Comfort cart for family Unrestricted visitations in the setting of EOL (per policy) Oxygen PRN 2L or less for comfort. No escalation.   PMT will continue to support and follow   Prognosis: < 2 weeks  Discharge Planning: Hospice facility  Thank you for allowing the Palliative Medicine Team to assist in the care of this patient.  Time Total: 65 min.  Visit consisted of counseling and education dealing with the complex and emotionally intense issues of symptom management and palliative care in the setting of serious and potentially life-threatening illness.Greater than 50%  of this time was spent counseling and coordinating care related to the above assessment and plan.  Alda Lea, AGPCNP-BC  Palliative Medicine Team [phone]

## 2021-02-28 NOTE — Progress Notes (Addendum)
PROGRESS NOTE    Brian Lara  NKN:[id number] DOB: 06-02-1926 DOA: 02/24/2021 PCP: Mick Sell, MD   Assessment & Plan:   Principal Problem:   Acute on chronic diastolic CHF (congestive heart failure) (HCC) Active Problems:   Hypertension   Atrial fibrillation (HCC)   Depression   CKD (chronic kidney disease), stage IIIa   Chronic respiratory failure (HCC)  Acute on chronic diastolic CHF: continue on IV lasix. S/p thoracentesis. BNP was elevated. Pt and pt's family proceed to go w/ comfort care only   Acute on chronic hypoxic respiratory failure: continue on supplemental oxygen & wean to baseline as tolerated. At home uses 4L [place].   Left pleural effusion: w/ left lower lobe collapse. S/p left thoracentesis on 02/26/21. No fluid studies were done as previous thoracentesis was consistent w/ CHF   Hypokalemia: WNL today  Chronic atrial fibrillation: continue on metoprolol, aspirin. No chronic anticoagulation secondary to high fall risk    HTN: continue on home dose of amlodipine, metoprolol and lasix  Depression: severity unknown. Continue mirtazapine and citalopram   Obesity: BMI 28.2. Would benefit from weight loss    COPD : w/o exacerbation. Not on daily bronchodilators   DVT prophylaxis: SCDs Code Status: DNR Family Communication: discussed pt's care w/ pt's family at bedside and answered their questions  Disposition Plan: likely d/c to hospice facility   Level of care: Progressive Cardiac  Status is: Inpatient  Remains inpatient appropriate because:IV treatments appropriate due to intensity of illness or inability to take PO and Inpatient level of care appropriate due to severity of illness  Dispo: The patient is from: Home              Anticipated d/c is to:  likely hospice facility               Patient currently is not medically stable to d/c.   Difficult to place patient : unclear    Consultants:  Palliative care  Cardio   Procedures:    Antimicrobials:   Subjective: Pt c/o weakness   Objective: Vitals:   02/27/21 1700 02/27/21 2141 02/28/21 0340 02/28/21 0738  BP: 118/60 (!) 115/55 (!) 158/76 (!) 168/91  Pulse: 69 (!) 54 68 71  Resp: 18 17 17 16   Temp: 98.2 F (36.8 C) 98.2 F (36.8 C) 97.7 F (36.5 C) 97.9 F (36.6 C)  TempSrc: Oral     SpO2: 95% 96% 97% 96%  Weight:        Intake/Output Summary (Last 24 hours) at 02/28/2021 0746 Last data filed at 02/27/2021 1802 Gross per 24 hour  Intake 480 ml  Output 1700 ml  Net -1220 ml   Filed Weights   02/26/21 0419 02/27/21 0600  Weight: 84 kg 81.8 kg    Examination:  General exam: Appears calm but uncomfortable  Respiratory system: diminshed breath sounds b/l Cardiovascular system: irregularly irregular. No  rubs, gallops or clicks. Gastrointestinal system: Abdomen is nondistended, soft and nontender.  Normal bowel sounds heard. Central nervous system: Alert and oriented. Moves all extremities Psychiatry: Judgement and insight appear normal. Flat mood and affect     Data Reviewed: I have personally reviewed following labs and imaging studies  CBC: Recent Labs  Lab 02/24/21 0757 02/26/21 0452 02/27/21 0539 02/28/21 0514  WBC 9.6 8.6 8.6 8.0  NEUTROABS 5.9  --   --   --   HGB 12.4* 12.5* 12.1* 11.7*  HCT 39.7 39.3 37.2* 37.0*  MCV 98.5 96.8 97.1 97.4  PLT 189 183 180 173   Basic Metabolic Panel: Recent Labs  Lab 02/24/21 0757 02/25/21 0613 02/26/21 0452 02/27/21 0539 02/28/21 0514  NA 138 141 137 136 137  K 3.5 3.4* 3.4* 3.4* 4.3  CL 88* 89* 86* 83* 85*  CO2 37* 40* 41* 43* 43*  GLUCOSE 125* 100* 98 98 95  BUN 11 11 13 15 15   CREATININE 0.81 0.84 0.77 0.75 0.82  CALCIUM 9.0 8.9 8.9 8.8* 8.8*  MG  --   --  1.8 1.7 1.8   GFR: Estimated Creatinine Clearance: 58.9 mL/min (by C-G formula based on SCr of 0.82 mg/dL). Liver Function Tests: Recent Labs  Lab 02/24/21 0757  AST 22  ALT 14  ALKPHOS 81  BILITOT 1.0  PROT 7.2   ALBUMIN 3.4*   No results for input(s): LIPASE, AMYLASE in the last 168 hours. No results for input(s): AMMONIA in the last 168 hours. Coagulation Profile: Recent Labs  Lab 02/24/21 0757  INR 1.1   Cardiac Enzymes: No results for input(s): CKTOTAL, CKMB, CKMBINDEX, TROPONINI in the last 168 hours. BNP (last 3 results) No results for input(s): PROBNP in the last 8760 hours. HbA1C: No results for input(s): HGBA1C in the last 72 hours. CBG: No results for input(s): GLUCAP in the last 168 hours. Lipid Profile: No results for input(s): CHOL, HDL, LDLCALC, TRIG, CHOLHDL, LDLDIRECT in the last 72 hours. Thyroid Function Tests: No results for input(s): TSH, T4TOTAL, FREET4, T3FREE, THYROIDAB in the last 72 hours. Anemia Panel: No results for input(s): VITAMINB12, FOLATE, FERRITIN, TIBC, IRON, RETICCTPCT in the last 72 hours. Sepsis Labs: Recent Labs  Lab 02/25/21 [id number]  PROCALCITON <0.10    Recent Results (from the past 240 hour(s))  Resp Panel by RT-PCR (Flu A&B, Covid) Nasopharyngeal Swab     Status: None   Collection Time: 02/24/21  7:57 AM   Specimen: Nasopharyngeal Swab; Nasopharyngeal(NP) swabs in vial transport medium  Result Value Ref Range Status   SARS Coronavirus 2 by RT PCR NEGATIVE NEGATIVE Final    Comment: (NOTE) SARS-CoV-2 target nucleic acids are NOT DETECTED.  The SARS-CoV-2 RNA is generally detectable in upper respiratory specimens during the acute phase of infection. The lowest concentration of SARS-CoV-2 viral copies this assay can detect is 138 copies/mL. A negative result does not preclude SARS-Cov-2 infection and should not be used as the sole basis for treatment or other patient management decisions. A negative result may occur with  improper specimen collection/handling, submission of specimen other than nasopharyngeal swab, presence of viral mutation(s) within the areas targeted by this assay, and inadequate number of viral copies(<138 copies/mL).  A negative result must be combined with clinical observations, patient history, and epidemiological information. The expected result is Negative.  Fact Sheet for Patients:  BloggerCourse.comhttps://www.fda.gov/media/152166/download  Fact Sheet for Healthcare Providers:  SeriousBroker.ithttps://www.fda.gov/media/152162/download  This test is no t yet approved or cleared by the [place] FDA and  has been authorized for detection and/or diagnosis of SARS-CoV-2 by FDA under an Emergency Use Authorization (EUA). This EUA will remain  in effect (meaning this test can be used) for the duration of the COVID-19 declaration under Section 564(b)(1) of the Act, 21 U.S.C.section 360bbb-3(b)(1), unless the authorization is terminated  or revoked sooner.       Influenza A by PCR NEGATIVE NEGATIVE Final   Influenza B by PCR NEGATIVE NEGATIVE Final    Comment: (NOTE) The Xpert Xpress SARS-CoV-2/FLU/RSV plus assay is intended as an aid in the diagnosis of influenza from  Nasopharyngeal swab specimens and should not be used as a sole basis for treatment. Nasal washings and aspirates are unacceptable for Xpert Xpress SARS-CoV-2/FLU/RSV testing.  Fact Sheet for Patients: BloggerCourse.com  Fact Sheet for Healthcare Providers: SeriousBroker.it  This test is not yet approved or cleared by the [place] FDA and has been authorized for detection and/or diagnosis of SARS-CoV-2 by FDA under an Emergency Use Authorization (EUA). This EUA will remain in effect (meaning this test can be used) for the duration of the COVID-19 declaration under Section 564(b)(1) of the Act, 21 U.S.C. section 360bbb-3(b)(1), unless the authorization is terminated or revoked.  Performed at [facility], [address], [place], [place] [address]          Radiology Studies: [facility] Chest [place] 1 View  Result Date: 02/26/2021 CLINICAL DATA:  Post left-sided thoracentesis. EXAM: PORTABLE  CHEST 1 VIEW COMPARISON:  02/24/2021; 01/12/2021; chest CT-02/24/2021 FINDINGS: Interval reduction in persistent small left-sided pleural effusion post thoracentesis. No pneumothorax. Unchanged small right-sided pleural effusion. Improved aeration of left lung base with persistent bibasilar heterogeneous/consolidative opacities, left greater than right. Pulmonary vasculature remains indistinct with cephalization of flow. No acute osseous abnormalities. IMPRESSION: 1. Interval reduction in persistent small left-sided effusion post thoracentesis. No pneumothorax. 2. Similar findings of cardiomegaly, pulmonary edema and small right-sided pleural effusion. 3. Minimally improved aeration of left lung base with persistent bibasilar heterogeneous opacities, atelectasis versus infiltrate. Electronically Signed   By: Simonne Come M.D.   On: 02/26/2021 14:32   US THORACENTESIS ASP PLEURAL SPACE W/IMG GUIDE  Result Date: 02/26/2021 INDICATION: Patient history of congestive heart failure, COPD, recurrent bilateral pleural effusions. Request to IR for therapeutic left thoracentesis. EXAM: ULTRASOUND GUIDED LEFT THORACENTESIS MEDICATIONS: 8 mL 1% lidocaine COMPLICATIONS: None immediate. PROCEDURE: An ultrasound guided thoracentesis was thoroughly discussed with the patient and questions answered. The benefits, risks, alternatives and complications were also discussed. The patient understands and wishes to proceed with the procedure. Written consent was obtained. Ultrasound was performed to localize and mark an adequate pocket of fluid in the left chest. The area was then prepped and draped in the normal sterile fashion. 1% Lidocaine was used for local anesthesia. Under ultrasound guidance a 6 Fr Safe-T-Centesis catheter was introduced. Thoracentesis was performed. The catheter was removed and a dressing applied. FINDINGS: A total of approximately 1.2 L of clear, golden fluid was removed. IMPRESSION: Successful ultrasound  guided left thoracentesis yielding 1.2 L of pleural fluid. Read by Lynnette Caffey, PA-C Electronically Signed   By: Richarda Overlie M.D.   On: 02/26/2021 13:38        Scheduled Meds:  amLODipine  10 mg Oral Daily   aspirin EC  325 mg Oral Daily   cholecalciferol  1,000 Units Oral Daily   citalopram  10 mg Oral Daily   enoxaparin (LOVENOX) injection  40 mg Subcutaneous Q24H   finasteride  5 mg Oral Daily   fluticasone  1 spray Each Nare Daily   furosemide  40 mg Intravenous BID   gabapentin  100 mg Oral BID   loratadine  10 mg Oral Daily   metoprolol tartrate  50 mg Oral BID   mirtazapine  15 mg Oral QHS   multivitamin with minerals  1 tablet Oral Daily   omega-3 acid ethyl esters  1 g Oral BID   potassium chloride  40 mEq Oral Daily   psyllium  1 packet Oral Daily   sodium chloride flush  3 mL Intravenous Q12H   Continuous  Infusions:  sodium chloride       LOS: 4 days    Time spent: 33 mins    Charise Killian, MD [facility] Pager [phone]  If 7PM-7AM, please contact night-coverage 02/28/2021, 7:46 AM

## 2021-03-01 DIAGNOSIS — I10 Essential (primary) hypertension: Secondary | ICD-10-CM

## 2021-03-01 DIAGNOSIS — R0602 Shortness of breath: Secondary | ICD-10-CM

## 2021-03-01 DIAGNOSIS — N1831 Chronic kidney disease, stage 3a: Secondary | ICD-10-CM | POA: Diagnosis not present

## 2021-03-01 DIAGNOSIS — I4821 Permanent atrial fibrillation: Secondary | ICD-10-CM

## 2021-03-01 DIAGNOSIS — I509 Heart failure, unspecified: Secondary | ICD-10-CM | POA: Diagnosis not present

## 2021-03-01 DIAGNOSIS — I5033 Acute on chronic diastolic (congestive) heart failure: Secondary | ICD-10-CM | POA: Diagnosis not present

## 2021-03-01 MED ORDER — MIRTAZAPINE 15 MG PO TABS
15.0000 mg | ORAL_TABLET | Freq: Every day | ORAL | Status: DC
  Administered 2021-03-01: 15 mg via ORAL
  Filled 2021-03-01: qty 1

## 2021-03-01 NOTE — Progress Notes (Signed)
   Daily Progress Note   Patient Name: Brian Lara       Date: 03/01/2021 DOB: 10/09/1925  Age: 87 y.o. MRN#: [id number] Attending Physician: Charise Killian, MD Primary Care Physician: Mick Sell, MD Admit Date: 02/24/2021  Reason for Consultation/Follow-up: Establishing goals of care  Subjective: Chart Reviewed. Updates Received. Patient Assessed.   Somnolent but easily aroused. Complains of dyspnea specifically with any type of movement. Family at the bedside (Granddaughter and son). Brian Lara shares patient's distress when moving around in bed or attempting any position outside of lying still in bed. Appetite remains poor with occasional sips.   Education provided on comfort and symptom management. Advised use of medications and for patient or family to contact nurse when distress to administer PRN medications. Family verbalized understanding and appreciation.   Pending hospice home approval. Patient is not able to return to [facility] and is fearful of symptom management needs and feeling of suffocating.   All questions answered and support provided.   Length of Stay: 5 days  Vital Signs: BP (!) 142/72 (BP Location: Right Arm)   Pulse 74   Temp 97.9 F (36.6 C)   Resp 16   Wt 81.8 kg   SpO2 95%   BMI 28.24 kg/m  SpO2: SpO2: 95 % O2 Device: O2 Device: Nasal Cannula O2 Flow Rate: O2 Flow Rate (L/min): 4 L/min  Physical Exam: Chronically-ill appearing RRR Diminished bilaterally, mild distress with movement, 3L/Brian Lara Will answer questions appropriately          Palliative Care Assessment & Plan  HPI: Palliative Care consult requested for goals of care discussion in this 87 y.o. male  with past medical history of atrial fibrillation (no anticoagulant), COPD (4L), hypertension, CHF, and hyperlipidemia. He was admitted on 02/24/2021 from home at [facility] independent living with complaints of worsening shortness of breath. Of note patient recently hospitalized  s/p left thoracentesis (1.2L). He was scheduled for repeat thoracentesis on 7/25 however was hospitalized. Since admission chest x-ray showed left lung collapse/consolidation with diffuse interstitial edema. Moderate left pleural effusion. S/p thoracentesis (1.2L) on 02/26/21. He is being followed by Cardiology.   Code Status: DNR  Goals of Care/Recommendations: Comfort Care with symptom management  Pending hospice home approval for ongoing end-of-life support Education provided on symptom management and use of medications to relieve distress PMT will continue to support and follow  Prognosis: POOR   Discharge Planning: To Be Determined  Thank you for allowing the Palliative Medicine Team to assist in the care of this patient.  Time Total: 25 min.   Visit consisted of counseling and education dealing with the complex and emotionally intense issues of symptom management and palliative care in the setting of serious and potentially life-threatening illness.Greater than 50%  of this time was spent counseling and coordinating care related to the above assessment and plan.  Willette Alma, AGPCNP-BC  Palliative Medicine Team [phone]

## 2021-03-01 NOTE — Care Management Important Message (Signed)
Important Message  Patient Details  Name: Brian Lara MRN: [id number] Date of Birth: 1926/03/05   Medicare Important Message Given:  Other (see comment)  Transferring to hospice home pending bed availability.  Medicare IM withheld at this time.    [name] 03/01/2021, 2:58 PM

## 2021-03-01 NOTE — Progress Notes (Addendum)
Per pt report takes mirtazapine for sleep but not order on MAR. MD Para March made aware. Will continue to monitor.  Update 2208: MD Para March ordered 15 mg oral daily at bedtime. Will continue to monitor.

## 2021-03-01 NOTE — Progress Notes (Addendum)
PROGRESS NOTE    Brian Lara  EGB:[id number] DOB: 15-Oct-1925 DOA: 02/24/2021 PCP: Mick Sell, MD   Assessment & Plan:   Principal Problem:   Acute on chronic diastolic CHF (congestive heart failure) (HCC) Active Problems:   Hypertension   Atrial fibrillation (HCC)   Depression   CKD (chronic kidney disease), stage IIIa   Chronic respiratory failure (HCC)  Acute on chronic diastolic CHF:  S/p thoracentesis. Continue w/ comfort care only   Acute on chronic hypoxic respiratory failure: continue on supplemental oxygen & wean to baseline as tolerated. At home uses 4L [place].   Left pleural effusion: w/ left lower lobe collapse. S/p left thoracentesis on 02/26/21. No fluid studies were done as previous thoracentesis was consistent w/ CHF   Hypokalemia: no labs as pt is comfort care only   Chronic atrial fibrillation: comfort care only    HTN: comfort care only   Depression: severity unknown. Continue on home dose of citalopram    Obesity: BMI 28.2.    COPD : w/o exacerbation. Not on daily bronchodilators   DVT prophylaxis: SCDs Code Status: DNR Family Communication: discussed pt's care w/ pt's family at bedside and answered their questions  Disposition Plan: likely d/c to hospice facility   Level of care: Progressive Cardiac  Status is: Inpatient  Remains inpatient appropriate because:IV treatments appropriate due to intensity of illness or inability to take PO and Inpatient level of care appropriate due to severity of illness  Dispo: The patient is from: Home              Anticipated d/c is to:  likely hospice facility               Patient currently is not medically stable to d/c.   Difficult to place patient : unclear    Consultants:  Palliative care  Cardio   Procedures:   Antimicrobials:   Subjective: Pt c/o intermittent shortness of breath   Objective: Vitals:   02/28/21 0738 02/28/21 1142 02/28/21 1922 03/01/21 0428  BP: (!) 168/91 116/63  126/70 (!) 142/72  Pulse: 71 (!) 48 (!) 59 74  Resp: 16 16 18 16   Temp: 97.9 F (36.6 C) 98.3 F (36.8 C) 98.4 F (36.9 C) 97.9 F (36.6 C)  TempSrc:      SpO2: 96% 95% 94% 95%  Weight:        Intake/Output Summary (Last 24 hours) at 03/01/2021 0751 Last data filed at 02/28/2021 2053 Gross per 24 hour  Intake 363 ml  Output --  Net 363 ml   Filed Weights   02/26/21 0419 02/27/21 0600  Weight: 84 kg 81.8 kg    Examination:  General exam: Appears uncomfortable Respiratory system: decreased breath sounds b/l  Cardiovascular system: irregularly irregular. Gastrointestinal system: Abd is soft, NT, ND & hypoactive bowel sounds  Central nervous system: Alert and oriented. Moves all extremities  Psychiatry: Judgement and insight appear normal. Flat mood and affect    Data Reviewed: I have personally reviewed following labs and imaging studies  CBC: Recent Labs  Lab 02/24/21 0757 02/26/21 0452 02/27/21 0539 02/28/21 0514  WBC 9.6 8.6 8.6 8.0  NEUTROABS 5.9  --   --   --   HGB 12.4* 12.5* 12.1* 11.7*  HCT 39.7 39.3 37.2* 37.0*  MCV 98.5 96.8 97.1 97.4  PLT 189 183 180 173   Basic Metabolic Panel: Recent Labs  Lab 02/24/21 0757 02/25/21 0613 02/26/21 0452 02/27/21 0539 02/28/21 0514  NA 138  141 137 136 137  K 3.5 3.4* 3.4* 3.4* 4.3  CL 88* 89* 86* 83* 85*  CO2 37* 40* 41* 43* 43*  GLUCOSE 125* 100* 98 98 95  BUN 11 11 13 15 15   CREATININE 0.81 0.84 0.77 0.75 0.82  CALCIUM 9.0 8.9 8.9 8.8* 8.8*  MG  --   --  1.8 1.7 1.8   GFR: Estimated Creatinine Clearance: 58.9 mL/min (by C-G formula based on SCr of 0.82 mg/dL). Liver Function Tests: Recent Labs  Lab 02/24/21 0757  AST 22  ALT 14  ALKPHOS 81  BILITOT 1.0  PROT 7.2  ALBUMIN 3.4*   No results for input(s): LIPASE, AMYLASE in the last 168 hours. No results for input(s): AMMONIA in the last 168 hours. Coagulation Profile: Recent Labs  Lab 02/24/21 0757  INR 1.1   Cardiac Enzymes: No results  for input(s): CKTOTAL, CKMB, CKMBINDEX, TROPONINI in the last 168 hours. BNP (last 3 results) No results for input(s): PROBNP in the last 8760 hours. HbA1C: No results for input(s): HGBA1C in the last 72 hours. CBG: No results for input(s): GLUCAP in the last 168 hours. Lipid Profile: No results for input(s): CHOL, HDL, LDLCALC, TRIG, CHOLHDL, LDLDIRECT in the last 72 hours. Thyroid Function Tests: No results for input(s): TSH, T4TOTAL, FREET4, T3FREE, THYROIDAB in the last 72 hours. Anemia Panel: No results for input(s): VITAMINB12, FOLATE, FERRITIN, TIBC, IRON, RETICCTPCT in the last 72 hours. Sepsis Labs: Recent Labs  Lab 02/25/21 02/27/21  PROCALCITON <0.10    Recent Results (from the past 240 hour(s))  Resp Panel by RT-PCR (Flu A&B, Covid) Nasopharyngeal Swab     Status: None   Collection Time: 02/24/21  7:57 AM   Specimen: Nasopharyngeal Swab; Nasopharyngeal(NP) swabs in vial transport medium  Result Value Ref Range Status   SARS Coronavirus 2 by RT PCR NEGATIVE NEGATIVE Final    Comment: (NOTE) SARS-CoV-2 target nucleic acids are NOT DETECTED.  The SARS-CoV-2 RNA is generally detectable in upper respiratory specimens during the acute phase of infection. The lowest concentration of SARS-CoV-2 viral copies this assay can detect is 138 copies/mL. A negative result does not preclude SARS-Cov-2 infection and should not be used as the sole basis for treatment or other patient management decisions. A negative result may occur with  improper specimen collection/handling, submission of specimen other than nasopharyngeal swab, presence of viral mutation(s) within the areas targeted by this assay, and inadequate number of viral copies(<138 copies/mL). A negative result must be combined with clinical observations, patient history, and epidemiological information. The expected result is Negative.  Fact Sheet for Patients:  02/26/21  Fact Sheet for  Healthcare Providers:  BloggerCourse.com  This test is no t yet approved or cleared by the [place] FDA and  has been authorized for detection and/or diagnosis of SARS-CoV-2 by FDA under an Emergency Use Authorization (EUA). This EUA will remain  in effect (meaning this test can be used) for the duration of the COVID-19 declaration under Section 564(b)(1) of the Act, 21 U.S.C.section 360bbb-3(b)(1), unless the authorization is terminated  or revoked sooner.       Influenza A by PCR NEGATIVE NEGATIVE Final   Influenza B by PCR NEGATIVE NEGATIVE Final    Comment: (NOTE) The Xpert Xpress SARS-CoV-2/FLU/RSV plus assay is intended as an aid in the diagnosis of influenza from Nasopharyngeal swab specimens and should not be used as a sole basis for treatment. Nasal washings and aspirates are unacceptable for Xpert Xpress SARS-CoV-2/FLU/RSV testing.  Fact  Sheet for Patients: BloggerCourse.com  Fact Sheet for Healthcare Providers: SeriousBroker.it  This test is not yet approved or cleared by the [place] FDA and has been authorized for detection and/or diagnosis of SARS-CoV-2 by FDA under an Emergency Use Authorization (EUA). This EUA will remain in effect (meaning this test can be used) for the duration of the COVID-19 declaration under Section 564(b)(1) of the Act, 21 U.S.C. section 360bbb-3(b)(1), unless the authorization is terminated or revoked.  Performed at [facility], [address] [place], [place] [address]          Radiology Studies: No results found.      Scheduled Meds:  citalopram  10 mg Oral Daily   finasteride  5 mg Oral Daily   gabapentin  100 mg Oral BID   sodium chloride flush  3 mL Intravenous Q12H   Continuous Infusions:  sodium chloride       LOS: 5 days    Time spent: 25 mins    Charise Killian, MD [facility] Pager [phone]  [phone]  If 7PM-7AM, please contact night-coverage 03/01/2021, 7:51 AM

## 2021-03-01 NOTE — Progress Notes (Addendum)
Manufacturing engineer (ACC)  Awaiting ACC MD approval for [facility] eligibility, however, there is not a bed open today.  Met with family, advised we are still waiting for approval for Hospice Home and updated that either way, we do not have a bed open today.  ACC will continue to follow and update hospital and family with our bed status.  Venia Carbon RN, BSN, [facility] Liaison   **Mr. Palau has been approved for residential hospice. ACC will update family and hospital once we have an open bed.

## 2021-03-02 DIAGNOSIS — N1831 Chronic kidney disease, stage 3a: Secondary | ICD-10-CM | POA: Diagnosis not present

## 2021-03-02 DIAGNOSIS — I509 Heart failure, unspecified: Secondary | ICD-10-CM | POA: Diagnosis not present

## 2021-03-02 DIAGNOSIS — I4821 Permanent atrial fibrillation: Secondary | ICD-10-CM | POA: Diagnosis not present

## 2021-03-02 DIAGNOSIS — J9621 Acute and chronic respiratory failure with hypoxia: Secondary | ICD-10-CM

## 2021-03-02 DIAGNOSIS — Z515 Encounter for palliative care: Secondary | ICD-10-CM

## 2021-03-02 DIAGNOSIS — Z66 Do not resuscitate: Secondary | ICD-10-CM

## 2021-03-02 DIAGNOSIS — R0602 Shortness of breath: Secondary | ICD-10-CM

## 2021-03-02 DIAGNOSIS — Z7189 Other specified counseling: Secondary | ICD-10-CM

## 2021-03-02 DIAGNOSIS — I5033 Acute on chronic diastolic (congestive) heart failure: Secondary | ICD-10-CM | POA: Diagnosis not present

## 2021-03-02 DIAGNOSIS — J90 Pleural effusion, not elsewhere classified: Secondary | ICD-10-CM

## 2021-03-02 MED ORDER — FINASTERIDE 5 MG PO TABS
5.0000 mg | ORAL_TABLET | Freq: Every day | ORAL | Status: DC
  Administered 2021-03-02: 5 mg via ORAL
  Filled 2021-03-02: qty 1

## 2021-03-02 NOTE — Progress Notes (Signed)
   Daily Progress Note   Patient Name: Brian Lara       Date: 03/02/2021 DOB: 06-30-1926  Age: 87 y.o. MRN#: [id number] Attending Physician: Charise Killian, MD Primary Care Physician: Mick Sell, MD Admit Date: 02/24/2021  Reason for Consultation/Follow-up: Establishing goals of care  Subjective: Chart Reviewed. Updates Received. Patient Assessed.   Patient awake, endorses shortness of breath. Appetite remains poor, minimal sips and bites. Family is at the bedside.   Updates and continued education provided on end-of-life expectations and symptom management. Family verbalized understanding and appreciation of care. They share notification that patient is able to transfer to the hospice home this afternoon. Family is tearful. Emotional support provided.   They confirm wishes to focus on his comfort and minimization of any distress. We discussed pre-medicating prior to transport for comfort and minimizing symptoms of respiratory distress.   All questions answered and support provided.   Length of Stay: 6 days  Vital Signs: BP (!) 157/78 (BP Location: Right Arm)   Pulse 75   Temp 98 F (36.7 C)   Resp 17   Wt 81.8 kg   SpO2 95%   BMI 28.24 kg/m  SpO2: SpO2: 95 % O2 Device: O2 Device: Nasal Cannula O2 Flow Rate: O2 Flow Rate (L/min): 4 L/min  Physical Exam: Comfort Care Chronically-ill appearing, pale/grey skin color Shortness of breath  Palliative Care Assessment & Plan  HPI: Palliative Care consult requested for goals of care discussion in this 87 y.o. male  with past medical history of atrial fibrillation (no anticoagulant), COPD (4L), hypertension, CHF, and hyperlipidemia. He was admitted on 02/24/2021 from home at [facility] independent living with complaints of worsening shortness of breath. Of note patient recently hospitalized s/p left thoracentesis (1.2L). He was scheduled for repeat thoracentesis on 7/25 however was hospitalized. Since admission  chest x-ray showed left lung collapse/consolidation with diffuse interstitial edema. Moderate left pleural effusion. S/p thoracentesis (1.2L) on 02/26/21. He is being followed by Cardiology.   Code Status: DNR  Goals of Care/Recommendations: Continue comfort care Premedication prior to transport to assist in minimizing symptoms during route. RN updated.  Plans to discharge later today to hospice home.   Prognosis: POOR   Discharge Planning: To Be Determined  Thank you for allowing the Palliative Medicine Team to assist in the care of this patient.  Time Total: 25 min.   Visit consisted of counseling and education dealing with the complex and emotionally intense issues of symptom management and palliative care in the setting of serious and potentially life-threatening illness.Greater than 50%  of this time was spent counseling and coordinating care related to the above assessment and plan.  Willette Alma, AGPCNP-BC  Palliative Medicine Team [phone]

## 2021-03-02 NOTE — Progress Notes (Signed)
Report called to Hospice Home - EMS to arrive later this evening to transport

## 2021-03-02 NOTE — Discharge Summary (Signed)
Physician Discharge Summary  Brian Lara ZOX:[id number] DOB: 1925-10-04 DOA: 02/24/2021  PCP: Mick Sell, MD  Admit date: 02/24/2021 Discharge date: 03/02/2021  Admitted From: home  Disposition:  hospice home   Recommendations for Outpatient Follow-up:  Follow up with hospice provider ASAP  Home Health: no  Equipment/Devices:  Discharge Condition: hospice  CODE STATUS: comfort care  Diet recommendation: whatever the pt can tolerate    Brief/Interim Summary: HPI was taken from Dr. Joylene Igo: Brian Lara is a 87 y.o. male with medical history significant for atrial fibrillation not on anticoagulation, COPD with chronic respiratory failure on 3 L of oxygen, history of chronic diastolic dysfunction CHF with last known LVEF of 55% from 06/22, hypertension and dyslipidemia who was brought into the ER by EMS for evaluation of shortness of breath. Patient was recently hospitalized and is status post left-sided thoracentesis with improvement in his symptoms.  He is scheduled for repeat thoracentesis on 02/26/21 but ended up in the emergency room. Patient and his granddaughter state that he has had issues with shortness of breath but he felt worse in the early hours of the morning on the day of his admission. He had his oxygen on at 3 L but felt he was not getting enough air.  He denies having any orthopnea.  He has bilateral lower extremity swelling but his granddaughter states that it is improved from a couple of days ago since he started wearing his compression socks.  He has some chest discomfort over the left anterior chest wall but denied having any chest pain.  He denied having any nausea, no vomiting, no palpitations, no diaphoresis.  He denies having any changes in his bowel habits, no abdominal pain, no fever, no chills, no urinary symptoms, no dizziness, no lightheadedness, no headache, no focal deficits, no blurred vision. Labs show sodium 138, potassium 3.5, chloride 88,  bicarb 27, glucose 125, BUN 11, creatinine 0.81, calcium 9.0, anion gap 13, alkaline phosphatase 81, albumin 3.4, AST 22, ALT 14, total protein 7.2, total bilirubin 1.0, BNP 739, troponin 18, white count 9.6, hemoglobin 12.4, hematocrit 39.7, MCV 98.5, RDW 13.6, platelet count 189 Respiratory viral panel is negative Chest x-ray reviewed by me shows diffuse interstitial edema and left lung collapse/consolidation.  Small to moderate left pleural effusion with small right effusion. Twelve-lead EKG reviewed by me shows atrial fibrillation.  Right bundle branch block and left anterior fascicular block.   ED Course: Patient is a 87 year old Caucasian male who presents to the emergency room for evaluation of worsening shortness of breath from his baseline. Chest x-ray shows diffuse interstitial edema. He received a dose of Lasix in the ER and will be admitted to the hospital for further evaluation  As per Dr. Marylu Lund: Brian Lara is a 87 y.o. male with medical history significant for atrial fibrillation not on anticoagulation, COPD with chronic respiratory failure on 4 L of oxygen, history of chronic diastolic dysfunction CHF with last known LVEF of 55% from 06/22, hypertension and dyslipidemia who was brought into the ER by EMS for evaluation of shortness of breath. Patient was recently hospitalized and is status post left-sided thoracentesis with improvement in his symptoms.  He is scheduled for repeat thoracentesis on 02/26/21 but ended up in the emergency room.   7/26 s/p left thoracentesis on 7/25, yielded 1.2 L of clear golden fluid.  Chest x-ray no pneumothorax postprocedure.   Hospital course from Dr. Mayford Knife 7/27-7/29/22: The day I started to care of the pt,  the pt and pt's family agreed to proceed w/ comfort care only. Pt has pain with trying to start urinating and requests finasteride be continued as pt has hx BPH. Pt will be d/c hospice home this evening. For more information, please see  previous progress/consult notes.    Discharge Diagnoses:  Principal Problem:   Acute on chronic diastolic CHF (congestive heart failure) (HCC) Active Problems:   Hypertension   Atrial fibrillation (HCC)   Depression   CKD (chronic kidney disease), stage IIIa   Chronic respiratory failure (HCC)  Acute on chronic diastolic CHF:  S/p thoracentesis. Continue w/ comfort care only   Acute on chronic hypoxic respiratory failure: continue on supplemental oxygen. At home uses 4L [place].   Left pleural effusion: w/ left lower lobe collapse. S/p left thoracentesis on 02/26/21. No fluid studies were done as previous thoracentesis was consistent w/ CHF   Hypokalemia: no labs as pt is comfort care only   Chronic atrial fibrillation: comfort care only    HTN: comfort care only   Depression: severity unknown. Continue on home dose of citalopram    Obesity: BMI 28.2.    COPD : w/o exacerbation. Not on daily bronchodilators  Discharge Instructions  Discharge Instructions     Diet - low sodium heart healthy   Complete by: As directed    Discharge instructions   Complete by: As directed    F/u w/ hospice provider ASAP   Increase activity slowly   Complete by: As directed       Allergies as of 03/02/2021       Reactions   Codeine Other (See Comments)   Morphine And Related Other (See Comments)        Medication List     STOP taking these medications    amLODipine 10 MG tablet Commonly known as: NORVASC   aspirin 325 MG tablet   atorvastatin 10 MG tablet Commonly known as: LIPITOR   Cholecalciferol 25 MCG (1000 UT) capsule   Fish Oil 1000 MG Cpdr   Glucosamine Sulfate 1000 MG Caps   metoprolol tartrate 50 MG tablet Commonly known as: LOPRESSOR   Multi-Vitamin tablet       TAKE these medications    Butalbital-APAP-Caffeine 50-325-40 MG capsule Take 1 capsule by mouth daily as needed for headache.   citalopram 10 MG tablet Commonly known as: CELEXA Take 10 mg  by mouth daily.   fexofenadine 180 MG tablet Commonly known as: ALLEGRA Take 60 mg by mouth daily.   finasteride 5 MG tablet Commonly known as: PROSCAR Take 5 mg by mouth daily.   fluticasone 50 MCG/ACT nasal spray Commonly known as: FLONASE Place 1 spray into both nostrils daily.   furosemide 40 MG tablet Commonly known as: LASIX Take 40 mg by mouth daily.   gabapentin 100 MG capsule Commonly known as: NEURONTIN Take 100 mg by mouth 2 (two) times daily.   mirtazapine 15 MG tablet Commonly known as: REMERON Take 15 mg by mouth at bedtime.   psyllium 58.6 % packet Commonly known as: METAMUCIL Take 1 packet by mouth daily.        Allergies  Allergen Reactions   Codeine Other (See Comments)   Morphine And Related Other (See Comments)    Consultations: Palliative care/hospice cardio  Procedures/Studies: CT CHEST WO CONTRAST  Result Date: 02/24/2021 CLINICAL DATA:  Pneumonia, effusion or abscess suspected, xray done EXAM: CT CHEST WITHOUT CONTRAST TECHNIQUE: Multidetector CT imaging of the chest was performed following the standard protocol  without IV contrast. COMPARISON:  January 11, 2021 FINDINGS: Cardiovascular: Cardiomegaly. Small pericardial effusion, minimally increased since prior. Three-vessel coronary artery atherosclerotic calcifications. Severe atherosclerotic calcifications of the aorta. Mediastinum/Nodes: Unchanged appearance of a heterogeneously enlarged LEFT thyroid gland. Mucous within the trachea. No axillary adenopathy. Prominent mediastinal lymph nodes, similar in comparison to prior with representative pretracheal lymph node measures 9 mm in the short axis, unchanged (series 2, image 53). Lungs/Pleura: There are moderate to large bilateral pleural effusions, similar in comparison to prior. Centrilobular emphysema. Near complete LEFT lower lobe collapse, minimally improved in comparison to prior. RIGHT lower lobe atelectasis, similar in comparison to prior.  Evaluation for fine parenchymal detail is limited due to respiratory motion. Interlobular septal thickening. Increased centrilobular nodularity throughout the RIGHT upper lobe and RIGHT middle lobe in comparison to prior. Resolution of RIGHT middle lobe ground-glass opacities. Upper Abdomen: Patulous esophagus.  No acute abnormality. Musculoskeletal: Multilevel degenerative changes of the thoracic spine. Remote LEFT anterior seventh rib fracture IMPRESSION: 1. Moderate to large bilateral pleural effusions, similar in comparison to prior CT. 2. Bibasilar atelectasis with near complete LEFT lower lobe collapse. 3. Interlobular septal thickening with scattered areas of centrilobular nodularity may reflect underlying pulmonary edema versus infectious/inflammatory etiology. 4. Cardiomegaly with a small pericardial effusion, minimally increased in size in comparison to prior. 5. Unchanged heterogeneously enlarged LEFT thyroid gland. In the setting of significant comorbidities or limited life expectancy, no follow-up imaging is recommended (ref: J Am Coll Radiol. 2015 Feb;12(2): 143-50). Aortic Atherosclerosis (ICD10-I70.0) and Emphysema (ICD10-J43.9). Electronically Signed   By: Meda Klinefelter MD   On: 02/24/2021 12:20   DG Chest Port 1 View  Result Date: 02/26/2021 CLINICAL DATA:  Post left-sided thoracentesis. EXAM: PORTABLE CHEST 1 VIEW COMPARISON:  02/24/2021; 01/12/2021; chest CT-02/24/2021 FINDINGS: Interval reduction in persistent small left-sided pleural effusion post thoracentesis. No pneumothorax. Unchanged small right-sided pleural effusion. Improved aeration of left lung base with persistent bibasilar heterogeneous/consolidative opacities, left greater than right. Pulmonary vasculature remains indistinct with cephalization of flow. No acute osseous abnormalities. IMPRESSION: 1. Interval reduction in persistent small left-sided effusion post thoracentesis. No pneumothorax. 2. Similar findings of  cardiomegaly, pulmonary edema and small right-sided pleural effusion. 3. Minimally improved aeration of left lung base with persistent bibasilar heterogeneous opacities, atelectasis versus infiltrate. Electronically Signed   By: Simonne Come M.D.   On: 02/26/2021 14:32   DG Chest Portable 1 View  Result Date: 02/24/2021 CLINICAL DATA:  Shortness of breath. EXAM: PORTABLE CHEST 1 VIEW COMPARISON:  01/12/2021 FINDINGS: 0801 hours. Low lung volumes. The cardio pericardial silhouette is enlarged. Diffuse interstitial opacity suggests edema. Retrocardiac left base collapse/consolidation is associated with small to moderate left and small right pleural effusions. The visualized bony structures of the thorax show no acute abnormality. Telemetry leads overlie the chest. IMPRESSION: Low volume chest film with diffuse interstitial pulmonary edema and left base collapse/consolidation. Small to moderate left pleural effusion with small right effusion Electronically Signed   By: Kennith Center M.D.   On: 02/24/2021 08:34   US THORACENTESIS ASP PLEURAL SPACE W/IMG GUIDE  Result Date: 02/26/2021 INDICATION: Patient history of congestive heart failure, COPD, recurrent bilateral pleural effusions. Request to IR for therapeutic left thoracentesis. EXAM: ULTRASOUND GUIDED LEFT THORACENTESIS MEDICATIONS: 8 mL 1% lidocaine COMPLICATIONS: None immediate. PROCEDURE: An ultrasound guided thoracentesis was thoroughly discussed with the patient and questions answered. The benefits, risks, alternatives and complications were also discussed. The patient understands and wishes to proceed with the procedure. Written consent was obtained. Ultrasound  was performed to localize and mark an adequate pocket of fluid in the left chest. The area was then prepped and draped in the normal sterile fashion. 1% Lidocaine was used for local anesthesia. Under ultrasound guidance a 6 Fr Safe-T-Centesis catheter was introduced. Thoracentesis was performed.  The catheter was removed and a dressing applied. FINDINGS: A total of approximately 1.2 L of clear, golden fluid was removed. IMPRESSION: Successful ultrasound guided left thoracentesis yielding 1.2 L of pleural fluid. Read by Lynnette Caffey, PA-C Electronically Signed   By: Richarda Overlie M.D.   On: 02/26/2021 13:38   (Echo, Carotid, EGD, Colonoscopy, ERCP)    Subjective: Pt c/o weakness   Discharge Exam: Vitals:   03/01/21 1931 03/02/21 1203  BP: 139/66 (!) 157/78  Pulse: 74 75  Resp: 17 17  Temp: 98.7 F (37.1 C) 98 F (36.7 C)  SpO2: 93% 95%   Vitals:   02/28/21 1922 03/01/21 0428 03/01/21 1931 03/02/21 1203  BP: 126/70 (!) 142/72 139/66 (!) 157/78  Pulse: (!) 59 74 74 75  Resp: 18 16 17 17   Temp: 98.4 F (36.9 C) 97.9 F (36.6 C) 98.7 F (37.1 C) 98 F (36.7 C)  TempSrc:   Oral   SpO2: 94% 95% 93% 95%  Weight:        General: Pt is alert, awake, not in acute distress Cardiovascular: S1/S2 +, no rubs, no gallops Respiratory: diminished breath sounds b/l Abdominal: Soft, NT, ND, bowel sounds + Extremities:  no cyanosis    The results of significant diagnostics from this hospitalization (including imaging, microbiology, ancillary and laboratory) are listed below for reference.     Microbiology: Recent Results (from the past 240 hour(s))  Resp Panel by RT-PCR (Flu A&B, Covid) Nasopharyngeal Swab     Status: None   Collection Time: 02/24/21  7:57 AM   Specimen: Nasopharyngeal Swab; Nasopharyngeal(NP) swabs in vial transport medium  Result Value Ref Range Status   SARS Coronavirus 2 by RT PCR NEGATIVE NEGATIVE Final    Comment: (NOTE) SARS-CoV-2 target nucleic acids are NOT DETECTED.  The SARS-CoV-2 RNA is generally detectable in upper respiratory specimens during the acute phase of infection. The lowest concentration of SARS-CoV-2 viral copies this assay can detect is 138 copies/mL. A negative result does not preclude SARS-Cov-2 infection and should not be  used as the sole basis for treatment or other patient management decisions. A negative result may occur with  improper specimen collection/handling, submission of specimen other than nasopharyngeal swab, presence of viral mutation(s) within the areas targeted by this assay, and inadequate number of viral copies(<138 copies/mL). A negative result must be combined with clinical observations, patient history, and epidemiological information. The expected result is Negative.  Fact Sheet for Patients:  02/26/21  Fact Sheet for Healthcare Providers:  BloggerCourse.com  This test is no t yet approved or cleared by the [place] FDA and  has been authorized for detection and/or diagnosis of SARS-CoV-2 by FDA under an Emergency Use Authorization (EUA). This EUA will remain  in effect (meaning this test can be used) for the duration of the COVID-19 declaration under Section 564(b)(1) of the Act, 21 U.S.C.section 360bbb-3(b)(1), unless the authorization is terminated  or revoked sooner.       Influenza A by PCR NEGATIVE NEGATIVE Final   Influenza B by PCR NEGATIVE NEGATIVE Final    Comment: (NOTE) The Xpert Xpress SARS-CoV-2/FLU/RSV plus assay is intended as an aid in the diagnosis of influenza from Nasopharyngeal swab specimens and  should not be used as a sole basis for treatment. Nasal washings and aspirates are unacceptable for Xpert Xpress SARS-CoV-2/FLU/RSV testing.  Fact Sheet for Patients: BloggerCourse.com  Fact Sheet for Healthcare Providers: SeriousBroker.it  This test is not yet approved or cleared by the [place] FDA and has been authorized for detection and/or diagnosis of SARS-CoV-2 by FDA under an Emergency Use Authorization (EUA). This EUA will remain in effect (meaning this test can be used) for the duration of the COVID-19 declaration under Section  564(b)(1) of the Act, 21 U.S.C. section 360bbb-3(b)(1), unless the authorization is terminated or revoked.  Performed at [facility], [address] [place], [place] [address]      Labs: BNP (last 3 results) Recent Labs    01/13/21 0536 02/24/21 0757 02/27/21 0539  BNP 360.4* 739.8* 421.9*   Basic Metabolic Panel: Recent Labs  Lab 02/24/21 0757 02/25/21 0613 02/26/21 0452 02/27/21 0539 02/28/21 0514  NA 138 141 137 136 137  K 3.5 3.4* 3.4* 3.4* 4.3  CL 88* 89* 86* 83* 85*  CO2 37* 40* 41* 43* 43*  GLUCOSE 125* 100* 98 98 95  BUN 11 11 13 15 15   CREATININE 0.81 0.84 0.77 0.75 0.82  CALCIUM 9.0 8.9 8.9 8.8* 8.8*  MG  --   --  1.8 1.7 1.8   Liver Function Tests: Recent Labs  Lab 02/24/21 0757  AST 22  ALT 14  ALKPHOS 81  BILITOT 1.0  PROT 7.2  ALBUMIN 3.4*   No results for input(s): LIPASE, AMYLASE in the last 168 hours. No results for input(s): AMMONIA in the last 168 hours. CBC: Recent Labs  Lab 02/24/21 0757 02/26/21 0452 02/27/21 0539 02/28/21 0514  WBC 9.6 8.6 8.6 8.0  NEUTROABS 5.9  --   --   --   HGB 12.4* 12.5* 12.1* 11.7*  HCT 39.7 39.3 37.2* 37.0*  MCV 98.5 96.8 97.1 97.4  PLT 189 183 180 173   Cardiac Enzymes: No results for input(s): CKTOTAL, CKMB, CKMBINDEX, TROPONINI in the last 168 hours. BNP: Invalid input(s): POCBNP CBG: No results for input(s): GLUCAP in the last 168 hours. D-Dimer No results for input(s): DDIMER in the last 72 hours. Hgb A1c No results for input(s): HGBA1C in the last 72 hours. Lipid Profile No results for input(s): CHOL, HDL, LDLCALC, TRIG, CHOLHDL, LDLDIRECT in the last 72 hours. Thyroid function studies No results for input(s): TSH, T4TOTAL, T3FREE, THYROIDAB in the last 72 hours.  Invalid input(s): FREET3 Anemia work up No results for input(s): VITAMINB12, FOLATE, FERRITIN, TIBC, IRON, RETICCTPCT in the last 72 hours. Urinalysis    Component Value Date/Time   COLORURINE YELLOW (A)  01/17/2016 1945   APPEARANCEUR CLEAR (A) 01/17/2016 1945   LABSPEC 1.009 01/17/2016 1945   PHURINE 6.0 01/17/2016 1945   GLUCOSEU NEGATIVE 01/17/2016 1945   HGBUR NEGATIVE 01/17/2016 1945   BILIRUBINUR NEGATIVE 01/17/2016 1945   KETONESUR NEGATIVE 01/17/2016 1945   PROTEINUR NEGATIVE 01/17/2016 1945   NITRITE NEGATIVE 01/17/2016 1945   LEUKOCYTESUR NEGATIVE 01/17/2016 1945   Sepsis Labs Invalid input(s): PROCALCITONIN,  WBC,  LACTICIDVEN Microbiology Recent Results (from the past 240 hour(s))  Resp Panel by RT-PCR (Flu A&B, Covid) Nasopharyngeal Swab     Status: None   Collection Time: 02/24/21  7:57 AM   Specimen: Nasopharyngeal Swab; Nasopharyngeal(NP) swabs in vial transport medium  Result Value Ref Range Status   SARS Coronavirus 2 by RT PCR NEGATIVE NEGATIVE Final    Comment: (NOTE) SARS-CoV-2 target nucleic  acids are NOT DETECTED.  The SARS-CoV-2 RNA is generally detectable in upper respiratory specimens during the acute phase of infection. The lowest concentration of SARS-CoV-2 viral copies this assay can detect is 138 copies/mL. A negative result does not preclude SARS-Cov-2 infection and should not be used as the sole basis for treatment or other patient management decisions. A negative result may occur with  improper specimen collection/handling, submission of specimen other than nasopharyngeal swab, presence of viral mutation(s) within the areas targeted by this assay, and inadequate number of viral copies(<138 copies/mL). A negative result must be combined with clinical observations, patient history, and epidemiological information. The expected result is Negative.  Fact Sheet for Patients:  BloggerCourse.comhttps://www.fda.gov/media/152166/download  Fact Sheet for Healthcare Providers:  SeriousBroker.ithttps://www.fda.gov/media/152162/download  This test is no t yet approved or cleared by the [place] FDA and  has been authorized for detection and/or diagnosis of SARS-CoV-2 by FDA under  an Emergency Use Authorization (EUA). This EUA will remain  in effect (meaning this test can be used) for the duration of the COVID-19 declaration under Section 564(b)(1) of the Act, 21 U.S.C.section 360bbb-3(b)(1), unless the authorization is terminated  or revoked sooner.       Influenza A by PCR NEGATIVE NEGATIVE Final   Influenza B by PCR NEGATIVE NEGATIVE Final    Comment: (NOTE) The Xpert Xpress SARS-CoV-2/FLU/RSV plus assay is intended as an aid in the diagnosis of influenza from Nasopharyngeal swab specimens and should not be used as a sole basis for treatment. Nasal washings and aspirates are unacceptable for Xpert Xpress SARS-CoV-2/FLU/RSV testing.  Fact Sheet for Patients: BloggerCourse.comhttps://www.fda.gov/media/152166/download  Fact Sheet for Healthcare Providers: SeriousBroker.ithttps://www.fda.gov/media/152162/download  This test is not yet approved or cleared by the [place] FDA and has been authorized for detection and/or diagnosis of SARS-CoV-2 by FDA under an Emergency Use Authorization (EUA). This EUA will remain in effect (meaning this test can be used) for the duration of the COVID-19 declaration under Section 564(b)(1) of the Act, 21 U.S.C. section 360bbb-3(b)(1), unless the authorization is terminated or revoked.  Performed at [facility], [address] [place], [place] [address]      Time coordinating discharge: Over 30 minutes  SIGNED:   Charise KillianJamiese M Haidan Nhan, MD  [facility] 03/02/2021, 12:51 PM Pager   If 7PM-7AM, please contact night-coverage

## 2021-03-02 NOTE — Progress Notes (Signed)
Patient care taken over by this RN. Family at bedside. No needs at this time. 

## 2021-03-02 NOTE — Progress Notes (Signed)
[facility]) Hospital Liaison RN Note  Per [facility] MD patient is eligible for the Hospice Home and there is a bed available today. Family is agreeable to transfer today.  Angeline Slim, LCSW [facility] Manager aware. RN please call report to Hospice Home at [phone]  Please send signed and completed DNR with patient at discharge.   Please call with any hospice related questions.   Thank you,   Bobbie "Einar Gip, RN, BSN [facility] Liaison [phone]

## 2021-03-02 NOTE — TOC Transition Note (Signed)
Transition of Care [facility]) - CM/SW Discharge Note   Patient Details  Name: Brian Lara MRN: [id number] Date of Birth: 1925/12/09  [facility] of [facility]) CM/SW Contact:  Gildardo Griffes, LCSW Phone Number: 03/02/2021, 9:20 AM   Clinical Narrative:     Patient will DC to: Hospice Home Anticipated DC date: 03/02/21 Family notified: son Brian Lara informed by [employer] by: [facility] at 8:30 pm  Per MD patient ready for DC to [facility]. RN, patient, patient's family, and facility notified of DC. Discharge Summary sent to facility. RN given number for report [phone]  . DC packet on chart. Ambulance transport requested for patient for 8:30 pm.   CSW signing off.  Angeline Slim, LCSW    Final next level of care: Hospice Medical Facility Barriers to Discharge: No Barriers Identified   Patient Goals and CMS Choice   CMS Medicare.gov Compare Post Acute Care list provided to:: Patient Represenative (must comment) (son Brian Lara) Choice offered to / list presented to : Adult Children  Discharge Placement                Patient to be transferred to facility by: [facility] Name of family member notified: son Brian Lara Patient and family notified of of transfer: 03/02/21  Discharge Plan and Services                                     Social Determinants of Health (SDOH) Interventions     Readmission Risk Interventions No flowsheet data found.

## 2021-03-02 NOTE — Plan of Care (Signed)

## 2021-03-06 ENCOUNTER — Ambulatory Visit: Payer: Medicare Other | Admitting: Family

## 2021-03-12 NOTE — Progress Notes (Deleted)
   Patient ID: [name], male    DOB: 1926/03/29, 87 y.o.   MRN: [id number]  HPI  Mr Salvia is a 87 y/o male with a history of  Echo report from 01/11/21 reviewed and showed an EF of 50-55% along with mild LAE and mild/moderate MR.   Admitted 02/24/21 due to shortness of breath. S/p left thoracentesis on 7/25, yielded 1.2 L of clear golden fluid. Cardiology and palliative care consults obtained.                  Discharged after 7 days.   Review of Systems    Physical Exam     Assessment & Plan:  1: Chronic heart failure with preserved ejection fraction with structural changes (LAE)- - NYHA class

## 2021-03-13 ENCOUNTER — Ambulatory Visit: Payer: Medicare Other | Admitting: Family

## 2021-04-05 DEATH — deceased
